# Patient Record
Sex: Female | Born: 1960 | Race: Black or African American | Hispanic: No | Marital: Married | Smoking: Never smoker
Health system: Southern US, Community
[De-identification: ages and names within clinical notes are randomized; demographics above are authoritative.]

## PROBLEM LIST (undated history)

## (undated) DIAGNOSIS — E669 Obesity, unspecified: Secondary | ICD-10-CM

## (undated) DIAGNOSIS — R7303 Prediabetes: Secondary | ICD-10-CM

## (undated) DIAGNOSIS — K589 Irritable bowel syndrome without diarrhea: Secondary | ICD-10-CM

## (undated) DIAGNOSIS — G4730 Sleep apnea, unspecified: Secondary | ICD-10-CM

## (undated) DIAGNOSIS — E559 Vitamin D deficiency, unspecified: Secondary | ICD-10-CM

## (undated) DIAGNOSIS — M549 Dorsalgia, unspecified: Secondary | ICD-10-CM

## (undated) DIAGNOSIS — M2550 Pain in unspecified joint: Secondary | ICD-10-CM

## (undated) DIAGNOSIS — N920 Excessive and frequent menstruation with regular cycle: Secondary | ICD-10-CM

## (undated) DIAGNOSIS — D649 Anemia, unspecified: Secondary | ICD-10-CM

## (undated) DIAGNOSIS — K829 Disease of gallbladder, unspecified: Secondary | ICD-10-CM

## (undated) DIAGNOSIS — R197 Diarrhea, unspecified: Secondary | ICD-10-CM

## (undated) DIAGNOSIS — E785 Hyperlipidemia, unspecified: Secondary | ICD-10-CM

## (undated) HISTORY — PX: LIPOSUCTION: SHX10

## (undated) HISTORY — DX: Hyperlipidemia, unspecified: E78.5

## (undated) HISTORY — DX: Prediabetes: R73.03

## (undated) HISTORY — PX: REDUCTION MAMMAPLASTY: SUR839

## (undated) HISTORY — DX: Dorsalgia, unspecified: M54.9

## (undated) HISTORY — DX: Pain in unspecified joint: M25.50

## (undated) HISTORY — DX: Vitamin D deficiency, unspecified: E55.9

## (undated) HISTORY — PX: OTHER SURGICAL HISTORY: SHX169

## (undated) HISTORY — PX: BREAST REDUCTION SURGERY: SHX8

## (undated) HISTORY — DX: Irritable bowel syndrome, unspecified: K58.9

## (undated) HISTORY — PX: BREAST BIOPSY: SHX20

## (undated) HISTORY — DX: Disease of gallbladder, unspecified: K82.9

## (undated) HISTORY — DX: Anemia, unspecified: D64.9

## (undated) HISTORY — DX: Excessive and frequent menstruation with regular cycle: N92.0

## (undated) HISTORY — DX: Obesity, unspecified: E66.9

## (undated) HISTORY — DX: Sleep apnea, unspecified: G47.30

## (undated) HISTORY — PX: CHOLECYSTECTOMY: SHX55

## (undated) HISTORY — DX: Diarrhea, unspecified: R19.7

---

## 2000-09-25 ENCOUNTER — Other Ambulatory Visit: Admission: RE | Admit: 2000-09-25 | Discharge: 2000-09-25 | Payer: Self-pay | Admitting: *Deleted

## 2002-07-22 ENCOUNTER — Other Ambulatory Visit: Admission: RE | Admit: 2002-07-22 | Discharge: 2002-07-22 | Payer: Self-pay | Admitting: Family Medicine

## 2007-02-09 ENCOUNTER — Other Ambulatory Visit: Admission: RE | Admit: 2007-02-09 | Discharge: 2007-02-09 | Payer: Self-pay | Admitting: Family Medicine

## 2010-07-23 ENCOUNTER — Ambulatory Visit: Payer: 59 | Attending: Family Medicine | Admitting: Physical Therapy

## 2010-07-23 DIAGNOSIS — M25619 Stiffness of unspecified shoulder, not elsewhere classified: Secondary | ICD-10-CM | POA: Insufficient documentation

## 2010-07-23 DIAGNOSIS — R293 Abnormal posture: Secondary | ICD-10-CM | POA: Insufficient documentation

## 2010-07-23 DIAGNOSIS — M542 Cervicalgia: Secondary | ICD-10-CM | POA: Insufficient documentation

## 2010-07-23 DIAGNOSIS — M25519 Pain in unspecified shoulder: Secondary | ICD-10-CM | POA: Insufficient documentation

## 2010-07-23 DIAGNOSIS — IMO0001 Reserved for inherently not codable concepts without codable children: Secondary | ICD-10-CM | POA: Insufficient documentation

## 2010-07-27 ENCOUNTER — Ambulatory Visit: Payer: 59 | Admitting: Physical Therapy

## 2010-07-30 ENCOUNTER — Ambulatory Visit: Payer: 59 | Admitting: Physical Therapy

## 2010-08-01 ENCOUNTER — Ambulatory Visit: Payer: 59 | Admitting: Physical Therapy

## 2010-08-03 ENCOUNTER — Ambulatory Visit: Payer: 59 | Admitting: Physical Therapy

## 2010-08-06 ENCOUNTER — Ambulatory Visit: Payer: 59 | Admitting: Physical Therapy

## 2010-08-08 ENCOUNTER — Ambulatory Visit: Payer: 59 | Admitting: Physical Therapy

## 2010-08-10 ENCOUNTER — Ambulatory Visit: Payer: 59 | Admitting: Physical Therapy

## 2010-08-13 ENCOUNTER — Ambulatory Visit: Payer: 59 | Attending: Family Medicine | Admitting: Physical Therapy

## 2010-08-13 DIAGNOSIS — IMO0001 Reserved for inherently not codable concepts without codable children: Secondary | ICD-10-CM | POA: Insufficient documentation

## 2010-08-13 DIAGNOSIS — M542 Cervicalgia: Secondary | ICD-10-CM | POA: Insufficient documentation

## 2010-08-13 DIAGNOSIS — R293 Abnormal posture: Secondary | ICD-10-CM | POA: Insufficient documentation

## 2010-08-13 DIAGNOSIS — M25619 Stiffness of unspecified shoulder, not elsewhere classified: Secondary | ICD-10-CM | POA: Insufficient documentation

## 2010-08-13 DIAGNOSIS — M25519 Pain in unspecified shoulder: Secondary | ICD-10-CM | POA: Insufficient documentation

## 2010-08-16 ENCOUNTER — Encounter: Payer: 59 | Admitting: Physical Therapy

## 2010-08-20 ENCOUNTER — Encounter: Payer: 59 | Admitting: Physical Therapy

## 2010-08-22 ENCOUNTER — Encounter: Payer: 59 | Admitting: Physical Therapy

## 2010-08-24 ENCOUNTER — Encounter: Payer: 59 | Admitting: Physical Therapy

## 2011-08-30 ENCOUNTER — Other Ambulatory Visit: Payer: Self-pay | Admitting: Family Medicine

## 2011-08-30 ENCOUNTER — Other Ambulatory Visit (HOSPITAL_COMMUNITY)
Admission: RE | Admit: 2011-08-30 | Discharge: 2011-08-30 | Disposition: A | Discharge status: Home / Self Care | Payer: 59 | Source: Ambulatory Visit | Attending: Family Medicine | Admitting: Family Medicine

## 2011-08-30 DIAGNOSIS — Z113 Encounter for screening for infections with a predominantly sexual mode of transmission: Secondary | ICD-10-CM | POA: Insufficient documentation

## 2011-08-30 DIAGNOSIS — Z1151 Encounter for screening for human papillomavirus (HPV): Secondary | ICD-10-CM | POA: Insufficient documentation

## 2011-08-30 DIAGNOSIS — Z124 Encounter for screening for malignant neoplasm of cervix: Secondary | ICD-10-CM | POA: Insufficient documentation

## 2012-09-03 ENCOUNTER — Encounter: Payer: Self-pay | Admitting: Internal Medicine

## 2012-09-11 HISTORY — PX: COLONOSCOPY: SHX174

## 2012-09-25 ENCOUNTER — Encounter: Payer: Self-pay | Admitting: Internal Medicine

## 2012-09-25 ENCOUNTER — Ambulatory Visit (INDEPENDENT_AMBULATORY_CARE_PROVIDER_SITE_OTHER): Payer: 59 | Admitting: Internal Medicine

## 2012-09-25 VITALS — BP 100/60 | HR 80 | Ht 61.0 in | Wt 168.2 lb

## 2012-09-25 DIAGNOSIS — Z1211 Encounter for screening for malignant neoplasm of colon: Secondary | ICD-10-CM

## 2012-09-25 DIAGNOSIS — K589 Irritable bowel syndrome without diarrhea: Secondary | ICD-10-CM

## 2012-09-25 MED ORDER — SOD PICOSULFATE-MAG OX-CIT ACD 10-3.5-12 MG-GM-GM PO PACK: 1.0000 | PACK | Freq: Once | ORAL | Status: DC

## 2012-09-25 NOTE — Patient Instructions (Addendum)
You have been scheduled for a colonoscopy with propofol. Please follow written instructions given to you at your visit today.  Please use the prep kit we have given you today, prepopik. If you use inhalers (even only as needed), please bring them with you on the day of your procedure. Your physician has requested that you go to www.startemmi.com and enter the access code given to you at your visit today. This web site gives a general overview about your procedure. However, you should still follow specific instructions given to you by our office regarding your preparation for the procedure.   I appreciate the opportunity to care for you.

## 2012-09-25 NOTE — Progress Notes (Signed)
Subjective:  Gretel Acre, MD   Patient ID: Guy Sandifer, female    DOB: [birth year], 52 y.o.   MRN: [id number]  HPI This very pleasant 52 yo woman originally from [place] is here because of chronic post-prandial loose and urgent defecation for 20+ years. She has tried loperamide, probiotics, dicyclomine and other ant-spasmodics. It began before she ever had a cholecystectomy.  She has never had a colonoscopy. No rectal bleeding.  She doeas have bloating and gas.  She denies nocturnal sxs and does not have this problem with urgent loose stools outside of eating or drinking. She has been gaining weight.  She has had a long-standing anemia - since childhood.  GI ROS o/w negative.  Allergies  Allergen Reactions  . Penicillins     Body stiffness   Outpatient Prescriptions Prior to Visit  Medication Sig Dispense Refill  . Iron-Vitamin C (VITRON-C PO) Take 1 capsule by mouth 2 (two) times daily. 125-200mg  tablets      . pravastatin (PRAVACHOL) 80 MG tablet Take 80 mg by mouth daily.      Marland Kitchen VITAMIN D, ERGOCALCIFEROL, PO Take 1 tablet by mouth once a week.      . pravastatin (PRAVACHOL) 40 MG tablet Take 40 mg by mouth daily.       No facility-administered medications prior to visit.   Past Medical History  Diagnosis Date  . Hyperlipidemia   . Anemia   . Menorrhagia   . Diarrhea    Past Surgical History  Procedure Laterality Date  . Breast reduction surgery    . Liposuction    . Cholecystectomy    . Uterine ablation      for menorrhagia   History   Social History  . Marital Status: Married    Spouse Name: N/A    Number of Children: 2  .     Occupational History  . Emergency planning/management officer at [employer]    Social History Main Topics  . Smoking status: Never Smoker   . Smokeless tobacco: Never Used  . Alcohol Use: No  . Drug Use: No    Family History  Problem Relation Age of Onset  . Glaucoma Father   . Hypertension Father   . Glaucoma Mother   . Hypertension Sister   .  Breast cancer Maternal Aunt     Review of Systems Menorrhagia and dysmenorrhea All other ROS negative except as in HPI    Objective:   Physical Exam General:  Well-developed, well-nourished and in no acute distress Eyes:  anicteric. Neck:   supple w/o thyromegaly or mass.  Lungs: Clear to auscultation bilaterally. Heart:  S1S2, no rubs, murmurs, gallops. Abdomen:  soft, non-tender, no hepatosplenomegaly, hernia, or mass and BS+.  Rectal: deferred Lymph:  no cervical or supraclavicular adenopathy. Extremities:   no edema Skin   no rash. Neuro:  A&O x 3.  Psych:  appropriate mood and  Affect.   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 She may be a candidate for Lotronex since she has failed numerous other agents in past. Some of the other medications she has tried have constipated her. Another option would be a bile salt binder like colestipol or cholestyramine. Since sxs predate cholecystectomy doubt bile salt-induced diarrhea but thes agents could possibly help.  Await results of screening colonoscopy. Keep celiac screening in mind though ethnicity atypical for that.  She does also have a chronic anemia, mildly microcytic, she says since childhood. Could be from menorrhagia but also could be a thalassemia. May need iron studies, etc and possibly Hgb electrophoresis if not ever done.  Special screening for malignant neoplasms, colon - Plan: schedule for screening colonoscopy The risks and benefits as well as alternatives of endoscopic procedure(s) have been discussed and reviewed. All questions answered. The patient agrees to proceed.  I appreciate the opportunity to care for this patient.  ZO:XWRUE, ADAKU, MD

## 2012-09-28 ENCOUNTER — Ambulatory Visit (AMBULATORY_SURGERY_CENTER): Payer: 59 | Admitting: Internal Medicine

## 2012-09-28 ENCOUNTER — Encounter: Payer: Self-pay | Admitting: Internal Medicine

## 2012-09-28 VITALS — BP 105/53 | HR 80 | Temp 98.7°F | Resp 16 | Ht 61.0 in | Wt 168.0 lb

## 2012-09-28 DIAGNOSIS — Z1211 Encounter for screening for malignant neoplasm of colon: Secondary | ICD-10-CM

## 2012-09-28 MED ORDER — SODIUM CHLORIDE 0.9 % IV SOLN: 500.0000 mL | INTRAVENOUS | Status: DC

## 2012-09-28 NOTE — Patient Instructions (Addendum)
The colonoscopy was normal except for rare diverticulosis. You had an excellent prep. I think the diarrhea problems are from Irritable Bowel Syndrome.  Please make an appointment to see me in the office and we can discuss further treatment of this problem.  You should repeat a colonoscopy in 2024 (10 years) for colon cancer screening and prevention.  I appreciate the opportunity to care for you. Iva Boop, MD, [facility]  Discharge instructions given with verbal understanding. Normal exam. Resume previous medications. YOU HAD AN ENDOSCOPIC PROCEDURE TODAY AT THE [facility]: Refer to the procedure report that was given to you for any specific questions about what was found during the examination.  If the procedure report does not answer your questions, please call your gastroenterologist to clarify.  If you requested that your care partner not be given the details of your procedure findings, then the procedure report has been included in a sealed envelope for you to review at your convenience later.  YOU SHOULD EXPECT: Some feelings of bloating in the abdomen. Passage of more gas than usual.  Walking can help get rid of the air that was put into your GI tract during the procedure and reduce the bloating. If you had a lower endoscopy (such as a colonoscopy or flexible sigmoidoscopy) you may notice spotting of blood in your stool or on the toilet paper. If you underwent a bowel prep for your procedure, then you may not have a normal bowel movement for a few days.  DIET: Your first meal following the procedure should be a light meal and then it is ok to progress to your normal diet.  A half-sandwich or bowl of soup is an example of a good first meal.  Heavy or fried foods are harder to digest and may make you feel nauseous or bloated.  Likewise meals heavy in dairy and vegetables can cause extra gas to form and this can also increase the bloating.  Drink plenty of fluids but you should  avoid alcoholic beverages for 24 hours.  ACTIVITY: Your care partner should take you home directly after the procedure.  You should plan to take it easy, moving slowly for the rest of the day.  You can resume normal activity the day after the procedure however you should NOT DRIVE or use heavy machinery for 24 hours (because of the sedation medicines used during the test).    SYMPTOMS TO REPORT IMMEDIATELY: A gastroenterologist can be reached at any hour.  During normal business hours, 8:30 AM to 5:00 PM Monday through Friday, call [phone].  After hours and on weekends, please call the GI answering service at [phone] who will take a message and have the physician on call contact you.   Following lower endoscopy (colonoscopy or flexible sigmoidoscopy):  Excessive amounts of blood in the stool  Significant tenderness or worsening of abdominal pains  Swelling of the abdomen that is new, acute  Fever of 100F or higher  FOLLOW UP: If any biopsies were taken you will be contacted by phone or by letter within the next 1-3 weeks.  Call your gastroenterologist if you have not heard about the biopsies in 3 weeks.  Our staff will call the home number listed on your records the next business day following your procedure to check on you and address any questions or concerns that you may have at that time regarding the information given to you following your procedure. This is a courtesy call and so if  there is no answer at the home number and we have not heard from you through the emergency physician on call, we will assume that you have returned to your regular daily activities without incident.  SIGNATURES/CONFIDENTIALITY: You and/or your care partner have signed paperwork which will be entered into your electronic medical record.  These signatures attest to the fact that that the information above on your After Visit Summary has been reviewed and is understood.  Full responsibility of the  confidentiality of this discharge information lies with you and/or your care-partner.

## 2012-09-28 NOTE — Op Note (Signed)
[facility] [address] [place], [address]   COLONOSCOPY PROCEDURE REPORT  PATIENT: [name], Rachael  MR#: [id number] BIRTHDATE: Oct 15, 1960 , 51  yrs. old GENDER: Female ENDOSCOPIST: Iva Boop, MD, [facility] REFERRED BY:   Dr. Fredirick Maudlin PROCEDURE DATE:  09/28/2012 PROCEDURE:   Colonoscopy, screening First Screening Colonoscopy - Avg.  risk and is 50 yrs.  old or older Yes.  Prior Negative Screening - Now for repeat screening. N/A  History of Adenoma - Now for follow-up colonoscopy & has been > or = to 3 yrs.  N/A  Polyps Removed Today? No.  Recommend repeat exam, <10 yrs? No. ASA CLASS:   Class II INDICATIONS:average risk screening and first colonoscopy. MEDICATIONS: Propofol (Diprivan) 340 mg IV, MAC sedation, administered by CRNA, and These medications were titrated to patient response per physician's verbal order  DESCRIPTION OF PROCEDURE:   After the risks benefits and alternatives of the procedure were thoroughly explained, informed consent was obtained.  A digital rectal exam revealed no abnormalities of the rectum.   The LB [id number]  endoscope was introduced through the anus and advanced to the terminal ileum which was intubated for a short distance. No adverse events experienced.   The quality of the prep was Prepopik excellent  The instrument was then slowly withdrawn as the colon was fully examined.   COLON FINDINGS: A normal appearing cecum, ileocecal valve, and appendiceal orifice were identified.  The ascending, hepatic flexure, transverse, splenic flexure, descending, sigmoid colon and rectum appeared unremarkable.  No polyps or cancers were seen.   A right colon retroflexion was performed.   The mucosa appeared normal in the terminal ileum.  Retroflexed views revealed no abnormalities. The time to cecum=1 minutes 38 seconds.  Withdrawal time=9 minutes 40 seconds.  The scope was withdrawn and the procedure completed. COMPLICATIONS:  There were no complications.  ENDOSCOPIC IMPRESSION: 1.   Normal colon - excellent prep - first colonoscopy 2.   Normal mucosa in the terminal ileum  RECOMMENDATIONS: 1.  Repeat colonoscopy 10 years. 2.   Call soon and make an appointment to see Dr.  Leone Payor in September or October to discuss treatment of IBS   eSigned:  Iva Boop, MD, [facility] 09/28/2012 4:46 PM  cc: The Patient   and Dr. Mervyn Skeeters Nnodi

## 2012-09-28 NOTE — Progress Notes (Signed)
Patient did not experience any of the following events: a burn prior to discharge; a fall within the facility; wrong site/side/patient/procedure/implant event; or a hospital transfer or hospital admission upon discharge from the facility. (G8907) Patient did not have preoperative order for IV antibiotic SSI prophylaxis. (G8918)  

## 2012-09-29 ENCOUNTER — Telehealth: Payer: Self-pay | Admitting: *Deleted

## 2012-09-29 NOTE — Telephone Encounter (Signed)
  Follow up Call-  Call back number 09/28/2012  Post procedure Call Back phone  # [phone]  Permission to leave phone message Yes     Patient questions:  Do you have a fever, pain , or abdominal swelling? no Pain Score  0 *  Have you tolerated food without any problems? yes  Have you been able to return to your normal activities? yes  Do you have any questions about your discharge instructions: Diet   no Medications  no Follow up visit  no  Do you have questions or concerns about your Care? no  Actions: * If pain score is 4 or above: No action needed, pain <4.

## 2013-09-20 ENCOUNTER — Other Ambulatory Visit: Payer: Self-pay

## 2014-09-06 DIAGNOSIS — E785 Hyperlipidemia, unspecified: Secondary | ICD-10-CM | POA: Insufficient documentation

## 2014-09-06 DIAGNOSIS — D649 Anemia, unspecified: Secondary | ICD-10-CM | POA: Insufficient documentation

## 2016-11-18 ENCOUNTER — Encounter: Payer: Self-pay | Admitting: Dietician

## 2016-11-18 ENCOUNTER — Encounter: Payer: 59 | Attending: Obstetrics and Gynecology | Admitting: Dietician

## 2016-11-18 DIAGNOSIS — Z6833 Body mass index (BMI) 33.0-33.9, adult: Secondary | ICD-10-CM | POA: Diagnosis not present

## 2016-11-18 DIAGNOSIS — K580 Irritable bowel syndrome with diarrhea: Secondary | ICD-10-CM | POA: Insufficient documentation

## 2016-11-18 DIAGNOSIS — Z713 Dietary counseling and surveillance: Secondary | ICD-10-CM | POA: Diagnosis not present

## 2016-11-18 NOTE — Progress Notes (Signed)
Medical Nutrition Therapy:  Appt start time: 1445 end time:  1445.   Assessment:  Primary concerns today: Patient is here today alone.  She has been referred for IBS with diarrhea.  She wants to know how to control this more.  Other history includes anemia, hyperlipidemia, and vitamin D deficiency.    Patient lives alone.  She works for [employer].  She is from [place], [place].  Her IBS symptoms are much better in [place] even when eating street food.  She brought several cookbooks with foods from her country that she enjoys.  She states that she tolerates all of them.  She has been in the [place] since 1991 and developed IBS symptoms in 1993/94.  Weight today is 177 lbs.  She reports gaining 30 lbs in the past 2 years despite not eating much and diarrhea often when she does eat.  Many foods also make her itch when she eats them as well as diarrhea. (tiger nuts, tree nuts, fruit).  She has tried a probiotic in the past but did not see a difference in her symptoms.  She will not eat and skip meals at times to avoid symptoms.  Preferred Learning Style:   No preference indicated   Learning Readiness:   Ready  MEDICATIONS: MVI   DIETARY INTAKE: Boring eating, don't eat sometimes because of her IBS.  Most food causes IBS.  She states that her symptoms are better when in [place]. Usual eating pattern includes 1-2 meals and 0 snacks per day.  24-hr recall:  B ( AM): water with vinegar (90% of the time)  Snk ( AM): none  L ( PM): sandwich from cafeteria at work ([place] with cheese) Snk ( PM): none D ( PM): skips most often ("drink water and go to sleep") or chicken, rice, salsa (tomatoes, peppers, onions) Snk ( PM): none Beverages: water, water with vinegar, rare regular soda, hot tea with sugar sometimes with small amount of canned milk, kambucha  Usual physical activity: week ends treadmill or walk in the park or gym or treadmill on weekdays when she is able to get out of work on  time.   Progress Towards Goal(s):  In progress.   Nutritional Diagnosis:  NB-1.2 Harmful beliefs/attitudes about food or nutrition-related topics (use with caution) As related to foods to help with IBS.  As evidenced by patient report and diet hx.    Intervention:  Nutrition counseling/education related to food tolerance, healthy nutrition, and IBS symptoms.  Discussed the brain/body connection as well.  Foods tolerated  Goat, lamb, poultry, fish  Rice  Milliet  Corn (pourage)  Greens   Peanuts  Sweet potatoes  Yucca  Boiled pineapple drink with Hibiscus and ginger  Fermented foods  Canned milk  Smoothie (spinach, kale, chia seeds, apple juice, ginger)  Tea  Plantains  Onions, cooked tomatoes  Eggs   Black eyed peas  Foods not tolerated  Tree nuts  Fruit (including bananas)  Milk (fresh)  Greasy foods  Mayo  Salad dressing  Carrots  Raw vegetables (except blended in smoothie)  Aim to avoid skipping meals as much as possible.  Breakfast (pourage, eggs OR smoothie)  Consider trying Pronourish supplement. Small meals Low fat Caffeine can cause problems with IBS.  Consider decaf tea. Avoid high fructose corn syrup  Consider getting checked for celiac disease (unless you have already) Resume taking your Vitamin D  Consider allergy testing  MRT test at [facility] (as an option) [phone]  Teaching Method Utilized:  Visual Auditory  Handouts given during visit include:  IBS nutrition therapy from [facility]  Barriers to learning/adherence to lifestyle change: stress  Demonstrated degree of understanding via:  Teach Back   Monitoring/Evaluation:  Dietary intake, exercise, and body weight prn.

## 2016-11-18 NOTE — Patient Instructions (Addendum)
Foods tolerated  Goat, lamb, poultry, fish  Rice  Milliet  Corn (pourage)  Greens   Peanuts  Sweet potatoes  Yucca  Boiled pineapple drink with Hibiscus and ginger  Fermented foods  Canned milk  Smoothie (spinach, kale, chia seeds, apple juice, ginger)  Tea  Plantains  Onions, cooked tomatoes  Eggs   Black eyed peas  Foods not tolerated  Tree nuts  Fruit (including bananas)  Milk (fresh)  Greasy foods  Mayo  Salad dressing  Carrots  Raw vegetables (except blended in smoothie)  Aim to avoid skipping meals as much as possible.  Breakfast (pourage, eggs OR smoothie)  Consider trying Pronourish supplement. Small meals Low fat Caffeine can cause problems with IBS.  Consider decaf tea. Avoid high fructose corn syrup  Consider getting checked for celiac disease (unless you have already) Resume taking your Vitamin D  Consider allergy testing  MRT test at [facility] (as an option) [phone]

## 2017-06-18 DIAGNOSIS — J069 Acute upper respiratory infection, unspecified: Secondary | ICD-10-CM | POA: Diagnosis not present

## 2017-08-18 DIAGNOSIS — Z0000 Encounter for general adult medical examination without abnormal findings: Secondary | ICD-10-CM | POA: Diagnosis not present

## 2017-08-18 DIAGNOSIS — Z1211 Encounter for screening for malignant neoplasm of colon: Secondary | ICD-10-CM | POA: Diagnosis not present

## 2017-08-18 DIAGNOSIS — E7800 Pure hypercholesterolemia, unspecified: Secondary | ICD-10-CM | POA: Diagnosis not present

## 2017-10-03 DIAGNOSIS — Z1231 Encounter for screening mammogram for malignant neoplasm of breast: Secondary | ICD-10-CM | POA: Diagnosis not present

## 2017-10-08 DIAGNOSIS — Z6834 Body mass index (BMI) 34.0-34.9, adult: Secondary | ICD-10-CM | POA: Diagnosis not present

## 2017-10-08 DIAGNOSIS — R87610 Atypical squamous cells of undetermined significance on cytologic smear of cervix (ASC-US): Secondary | ICD-10-CM | POA: Diagnosis not present

## 2017-10-08 DIAGNOSIS — Z01411 Encounter for gynecological examination (general) (routine) with abnormal findings: Secondary | ICD-10-CM | POA: Diagnosis not present

## 2017-10-08 DIAGNOSIS — Z124 Encounter for screening for malignant neoplasm of cervix: Secondary | ICD-10-CM | POA: Diagnosis not present

## 2017-11-28 DIAGNOSIS — R52 Pain, unspecified: Secondary | ICD-10-CM | POA: Diagnosis not present

## 2017-12-04 ENCOUNTER — Other Ambulatory Visit: Payer: Self-pay | Admitting: Obstetrics and Gynecology

## 2017-12-04 DIAGNOSIS — N72 Inflammatory disease of cervix uteri: Secondary | ICD-10-CM | POA: Diagnosis not present

## 2017-12-04 DIAGNOSIS — R87610 Atypical squamous cells of undetermined significance on cytologic smear of cervix (ASC-US): Secondary | ICD-10-CM | POA: Diagnosis not present

## 2019-12-01 ENCOUNTER — Other Ambulatory Visit: Payer: Self-pay | Admitting: Obstetrics and Gynecology

## 2021-04-19 ENCOUNTER — Ambulatory Visit
Admission: RE | Admit: 2021-04-19 | Discharge: 2021-04-19 | Disposition: A | Discharge status: Home / Self Care | Payer: 59 | Source: Ambulatory Visit | Attending: Family Medicine | Admitting: Family Medicine

## 2021-04-19 ENCOUNTER — Other Ambulatory Visit: Payer: Self-pay | Admitting: Family Medicine

## 2021-04-19 DIAGNOSIS — R102 Pelvic and perineal pain: Secondary | ICD-10-CM

## 2021-12-12 ENCOUNTER — Encounter (INDEPENDENT_AMBULATORY_CARE_PROVIDER_SITE_OTHER): Payer: 59 | Admitting: Family Medicine

## 2021-12-12 DIAGNOSIS — Z0289 Encounter for other administrative examinations: Secondary | ICD-10-CM

## 2021-12-27 ENCOUNTER — Ambulatory Visit (INDEPENDENT_AMBULATORY_CARE_PROVIDER_SITE_OTHER): Payer: 59 | Admitting: Family Medicine

## 2021-12-27 ENCOUNTER — Encounter (INDEPENDENT_AMBULATORY_CARE_PROVIDER_SITE_OTHER): Payer: Self-pay | Admitting: Family Medicine

## 2021-12-27 VITALS — BP 126/78 | HR 100 | Temp 98.4°F | Ht 62.0 in | Wt 181.0 lb

## 2021-12-27 DIAGNOSIS — R7303 Prediabetes: Secondary | ICD-10-CM

## 2021-12-27 DIAGNOSIS — Z6834 Body mass index (BMI) 34.0-34.9, adult: Secondary | ICD-10-CM

## 2021-12-27 DIAGNOSIS — E782 Mixed hyperlipidemia: Secondary | ICD-10-CM

## 2021-12-27 DIAGNOSIS — E559 Vitamin D deficiency, unspecified: Secondary | ICD-10-CM | POA: Diagnosis not present

## 2021-12-27 DIAGNOSIS — Z1331 Encounter for screening for depression: Secondary | ICD-10-CM | POA: Diagnosis not present

## 2021-12-27 DIAGNOSIS — E669 Obesity, unspecified: Secondary | ICD-10-CM

## 2021-12-27 DIAGNOSIS — R5383 Other fatigue: Secondary | ICD-10-CM | POA: Diagnosis not present

## 2021-12-27 DIAGNOSIS — R0602 Shortness of breath: Secondary | ICD-10-CM | POA: Diagnosis not present

## 2021-12-27 DIAGNOSIS — G4733 Obstructive sleep apnea (adult) (pediatric): Secondary | ICD-10-CM

## 2021-12-28 LAB — LIPID PANEL WITH LDL/HDL RATIO
Cholesterol, Total: 143 mg/dL (ref 100–199)
HDL: 50 mg/dL (ref >39)
LDL Chol Calc (NIH): 78 mg/dL (ref 0–99)
LDL/HDL Ratio: 1.6 ratio (ref 0.0–3.2)
Triglycerides: 79 mg/dL (ref 0–149)
VLDL Cholesterol Cal: 15 mg/dL (ref 5–40)

## 2021-12-28 LAB — FOLATE: Folate: >20 ng/mL (ref >3.0)

## 2021-12-28 LAB — COMPREHENSIVE METABOLIC PANEL
ALT: 22 IU/L (ref 0–32)
AST: 21 IU/L (ref 0–40)
Albumin/Globulin Ratio: 2 (ref 1.2–2.2)
Albumin: 4.9 g/dL (ref 3.8–4.9)
Alkaline Phosphatase: 79 IU/L (ref 44–121)
BUN/Creatinine Ratio: 11 — ABNORMAL LOW (ref 12–28)
BUN: 6 mg/dL — ABNORMAL LOW (ref 8–27)
Bilirubin Total: 0.3 mg/dL (ref 0.0–1.2)
CO2: 24 mmol/L (ref 20–29)
Calcium: 9.5 mg/dL (ref 8.7–10.3)
Chloride: 103 mmol/L (ref 96–106)
Creatinine, Ser: 0.56 mg/dL — ABNORMAL LOW (ref 0.57–1.00)
Globulin, Total: 2.4 g/dL (ref 1.5–4.5)
Glucose: 94 mg/dL (ref 70–99)
Potassium: 3.7 mmol/L (ref 3.5–5.2)
Sodium: 142 mmol/L (ref 134–144)
Total Protein: 7.3 g/dL (ref 6.0–8.5)
eGFR: 104 mL/min/{1.73_m2} (ref >59)

## 2021-12-28 LAB — CBC WITH DIFFERENTIAL/PLATELET
Basophils Absolute: 0 10*3/uL (ref 0.0–0.2)
Basos: 1 %
EOS (ABSOLUTE): 0.1 10*3/uL (ref 0.0–0.4)
Eos: 1 %
Hematocrit: 39 % (ref 34.0–46.6)
Hemoglobin: 12 g/dL (ref 11.1–15.9)
Immature Grans (Abs): 0 10*3/uL (ref 0.0–0.1)
Immature Granulocytes: 0 %
Lymphocytes Absolute: 1.7 10*3/uL (ref 0.7–3.1)
Lymphs: 38 %
MCH: 26.7 pg (ref 26.6–33.0)
MCHC: 30.8 g/dL — ABNORMAL LOW (ref 31.5–35.7)
MCV: 87 fL (ref 79–97)
Monocytes Absolute: 0.2 10*3/uL (ref 0.1–0.9)
Monocytes: 5 %
Neutrophils Absolute: 2.5 10*3/uL (ref 1.4–7.0)
Neutrophils: 55 %
Platelets: 213 10*3/uL (ref 150–450)
RBC: 4.49 x10E6/uL (ref 3.77–5.28)
RDW: 14 % (ref 11.7–15.4)
WBC: 4.5 10*3/uL (ref 3.4–10.8)

## 2021-12-28 LAB — VITAMIN B12: Vitamin B-12: 900 pg/mL (ref 232–1245)

## 2021-12-28 LAB — TSH: TSH: 1.45 u[IU]/mL (ref 0.450–4.500)

## 2021-12-28 LAB — T3: T3, Total: 137 ng/dL (ref 71–180)

## 2021-12-28 LAB — HEMOGLOBIN A1C
Est. average glucose Bld gHb Est-mCnc: 128 mg/dL
Hgb A1c MFr Bld: 6.1 % — ABNORMAL HIGH (ref 4.8–5.6)

## 2021-12-28 LAB — T4, FREE: Free T4: 1.44 ng/dL (ref 0.82–1.77)

## 2021-12-28 LAB — INSULIN, RANDOM: INSULIN: 12.1 u[IU]/mL (ref 2.6–24.9)

## 2021-12-28 LAB — VITAMIN D 25 HYDROXY (VIT D DEFICIENCY, FRACTURES): Vit D, 25-Hydroxy: 29.8 ng/mL — ABNORMAL LOW (ref 30.0–100.0)

## 2022-01-09 ENCOUNTER — Ambulatory Visit (INDEPENDENT_AMBULATORY_CARE_PROVIDER_SITE_OTHER): Payer: 59 | Admitting: Primary Care

## 2022-01-09 VITALS — BP 122/86 | HR 116

## 2022-01-09 DIAGNOSIS — R5383 Other fatigue: Secondary | ICD-10-CM | POA: Diagnosis not present

## 2022-01-09 DIAGNOSIS — E559 Vitamin D deficiency, unspecified: Secondary | ICD-10-CM | POA: Insufficient documentation

## 2022-01-09 DIAGNOSIS — R0683 Snoring: Secondary | ICD-10-CM | POA: Diagnosis not present

## 2022-01-09 DIAGNOSIS — G4709 Other insomnia: Secondary | ICD-10-CM | POA: Diagnosis not present

## 2022-01-09 DIAGNOSIS — G4700 Insomnia, unspecified: Secondary | ICD-10-CM | POA: Insufficient documentation

## 2022-01-09 DIAGNOSIS — N926 Irregular menstruation, unspecified: Secondary | ICD-10-CM | POA: Insufficient documentation

## 2022-01-09 MED ORDER — DIPHENHYDRAMINE HCL 50 MG PO TABS: 50.0000 mg | ORAL_TABLET | Freq: Every evening | ORAL | 0 refills | Status: DC | PRN

## 2022-01-09 MED ORDER — DIPHENHYDRAMINE HCL 50 MG PO TABS: 50.0000 mg | ORAL_TABLET | Freq: Every evening | ORAL | 0 refills | Status: AC | PRN

## 2022-01-09 NOTE — Progress Notes (Signed)
@Patient  ID: , female    DOB: 06-09-1960, 61 y.o.   MRN: [id number]  Chief Complaint  Patient presents with   Consult    Referring provider: [id number], MD  HPI: 61 year old female, ever smoked.  Past medical history significant for IBS, anemia, hyperlipidemia, vitamin D deficiency, fatigue.   01/09/2022 Patient presents today for sleep consult. She has symptoms for chronic fatigue and insomnia. She has issues sleeping at night. She reports loud snoring.  Typical bedtime is between 9 and 10 PM.  It can take her up to 2 to 3 hours to fall asleep. She will take NyQuil after a night or two of not sleeping well which will help her fall asleep. She wakes up on average once a night.  She starts her day at 5 AM.  Weight is up 25 pounds.  No previous sleep study.  She works from home. Denies symptoms of narcolepsy, cataplexy or sleepwalking.  Sleep questionnaire Symptoms- sleeping less than 5 hours a night, struggle to sleep    Prior sleep study- none  Bedtime- 9-10pm  Time to fall asleep- 2-3 hours Nocturnal awakenings- once Out of bed/start of day- 5am Weight changes- up 25 lbs Do you operate heavy machinery- no Do you currently wear CPAP- no Do you current wear oxygen- no Epworth- 5  Allergies  Allergen Reactions   Penicillins     Body stiffness    Immunization History  Administered Date(s) Administered   PFIZER Comirnaty(Gray Top)Covid-19 Tri-Sucrose Vaccine 10/02/2020   Tdap 08/30/2011    Past Medical History:  Diagnosis Date   Anemia    Back pain    Diarrhea    Gallbladder problem    Hyperlipidemia    IBS (irritable bowel syndrome)    Joint pain    Menorrhagia    Obesity    Prediabetes    Sleep apnea    Vitamin D deficiency     Tobacco History: Social History   Tobacco Use  Smoking Status Never  Smokeless Tobacco Never   Counseling given: Not Answered   Outpatient Medications Prior to Visit  Medication Sig Dispense Refill    folic acid (FOLVITE) 1 MG tablet Take 1 mg by mouth daily.     Probiotic Product (PROBIOTIC DAILY PO) Take by mouth.     Rhubarb (ESTROVERA) 4 MG TABS Take 4 mg by mouth.     rosuvastatin (CRESTOR) 10 MG tablet Take 10 mg by mouth daily.     No facility-administered medications prior to visit.    Review of Systems  Review of Systems  Constitutional: Negative.   HENT: Negative.    Respiratory: Negative.    Cardiovascular: Negative.   Psychiatric/Behavioral:  Positive for sleep disturbance.     Physical Exam  BP 122/86 (BP Location: Left Arm, Cuff Size: Normal)   Pulse (!) 116   LMP 09/22/2012   SpO2 99%  Physical Exam Constitutional:      General: She is not in acute distress.    Appearance: Normal appearance. She is not ill-appearing.  HENT:     Head: Normocephalic and atraumatic.     Mouth/Throat:     Mouth: Mucous membranes are moist.     Pharynx: Oropharynx is clear.  Cardiovascular:     Rate and Rhythm: Regular rhythm. Tachycardia present.     Comments: Regular rhythm; HR between 100-115 Pulmonary:     Effort: Pulmonary effort is normal.     Breath sounds: Normal breath sounds. No wheezing, rhonchi  or rales.  Skin:    General: Skin is warm and dry.  Neurological:     General: No focal deficit present.     Mental Status: She is alert and oriented to person, place, and time. Mental status is at baseline.  Psychiatric:        Mood and Affect: Mood normal.        Behavior: Behavior normal.        Thought Content: Thought content normal.        Judgment: Judgment normal.      Lab Results:  CBC    Component Value Date/Time   WBC 4.5 12/27/2021 0920   RBC 4.49 12/27/2021 0920   HGB 12.0 12/27/2021 0920   HCT 39.0 12/27/2021 0920   PLT 213 12/27/2021 0920   MCV 87 12/27/2021 0920   MCH 26.7 12/27/2021 0920   MCHC 30.8 (L) 12/27/2021 0920   RDW 14.0 12/27/2021 0920   LYMPHSABS 1.7 12/27/2021 0920   EOSABS 0.1 12/27/2021 0920   BASOSABS 0.0 12/27/2021  0920    BMET    Component Value Date/Time   NA 142 12/27/2021 0920   K 3.7 12/27/2021 0920   CL 103 12/27/2021 0920   CO2 24 12/27/2021 0920   GLUCOSE 94 12/27/2021 0920   BUN 6 (L) 12/27/2021 0920   CREATININE 0.56 (L) 12/27/2021 0920   CALCIUM 9.5 12/27/2021 0920    BNP No results found for: "BNP"  ProBNP No results found for: "PROBNP"  Imaging: No results found.   Assessment & Plan:   Snoring - Patient has symptoms of loud snoring, insomnia and fatigue. Epworth score 5. Concern patient could have underlying sleep apnea. Needs home sleep study to evaluate.  Discussed risks of untreated sleep apnea including cardiac arrhythmias, PE, pulmonary hypertension and diabetes.  We also reviewed treatment options including weight loss, oral appliance, CPAP or referral to ENT for possible surgical options.  Encourage weight loss efforts and side sleeping position.  Advised against driving if experiencing excessive daytime sleepiness fatigue.  Follow-up in 1 to 2 weeks after sleep study review results and treatment options  Fatigue - Labs with PCP were essentially normal  - Needs HST to rule out sleep apnea as cause of fatigue  Insomnia - Encourage patient maintain routine sleep schedule and limit amount of time she spends in bed not sleeping - Rx diphenhydramine 50mg  at bedtime for insomnia    , NP 01/09/2022

## 2022-01-09 NOTE — Assessment & Plan Note (Signed)
-   Encourage patient maintain routine sleep schedule and limit amount of time she spends in bed not sleeping - Rx diphenhydramine 50mg  at bedtime for insomnia

## 2022-01-09 NOTE — Assessment & Plan Note (Signed)
-   Patient has symptoms of loud snoring, insomnia and fatigue. Epworth score 5. Concern patient could have underlying sleep apnea. Needs home sleep study to evaluate.  Discussed risks of untreated sleep apnea including cardiac arrhythmias, PE, pulmonary hypertension and diabetes.  We also reviewed treatment options including weight loss, oral appliance, CPAP or referral to ENT for possible surgical options.  Encourage weight loss efforts and side sleeping position.  Advised against driving if experiencing excessive daytime sleepiness fatigue.  Follow-up in 1 to 2 weeks after sleep study review results and treatment options

## 2022-01-09 NOTE — Assessment & Plan Note (Addendum)
-   Labs with PCP were essentially normal  - Needs HST to rule out sleep apnea as cause of fatigue

## 2022-01-09 NOTE — Patient Instructions (Signed)
  Sleep apnea is defined as period of 10 seconds or longer when you stop breathing at night. This can happen multiple times a night. Dx sleep apnea is when this occurs more than 5 times an hour.    Mild OSA 5-15 apneic events an hour Moderate OSA 15-30 apneic events an hour Severe OSA > 30 apneic events an hour   Untreated sleep apnea puts you at higher risk for cardiac arrhythmias, pulmonary HTN, stroke and diabetes  Treatment options include weight loss, side sleeping position, oral appliance, CPAP therapy or referral to ENT for possible surgical options    Recommendations: - Focus on side sleeping position or elevate head with wedge pillow 30 degrees - Work on weight loss efforts if able  - Do not drive if experiencing excessive daytime sleepiness of fatigue  - Take diphenhydramine 50mg  at bedtime for insomnia    Orders: Home sleep study re: loud snoring    Follow-up: Please call to schedule follow-up 1-2 weeks after completing home sleep study to review results and treatment if needed (can be virtual)

## 2022-01-10 ENCOUNTER — Ambulatory Visit (INDEPENDENT_AMBULATORY_CARE_PROVIDER_SITE_OTHER): Payer: 59 | Admitting: Family Medicine

## 2022-01-10 VITALS — BP 122/80 | HR 78 | Temp 98.5°F | Ht 62.0 in | Wt 180.0 lb

## 2022-01-10 DIAGNOSIS — E7849 Other hyperlipidemia: Secondary | ICD-10-CM | POA: Diagnosis not present

## 2022-01-10 DIAGNOSIS — R5383 Other fatigue: Secondary | ICD-10-CM

## 2022-01-10 DIAGNOSIS — E559 Vitamin D deficiency, unspecified: Secondary | ICD-10-CM

## 2022-01-10 DIAGNOSIS — Z6833 Body mass index (BMI) 33.0-33.9, adult: Secondary | ICD-10-CM

## 2022-01-10 DIAGNOSIS — R7303 Prediabetes: Secondary | ICD-10-CM | POA: Diagnosis not present

## 2022-01-10 DIAGNOSIS — E669 Obesity, unspecified: Secondary | ICD-10-CM

## 2022-01-10 MED ORDER — VITAMIN D (ERGOCALCIFEROL) 1.25 MG (50000 UNIT) PO CAPS: 50000.0000 [IU] | ORAL_CAPSULE | ORAL | 0 refills | Status: DC

## 2022-01-10 NOTE — Progress Notes (Signed)
Reviewed and agree with assessment/plan.   Coralyn Helling, MD [facility] Pulmonary/Critical Care 01/10/2022, 8:15 AM Pager:  [phone]

## 2022-01-14 NOTE — Progress Notes (Signed)
Chief Complaint:   OBESITY Rachael [name] (MR# [id number]) is a 61 y.o. female who presents for evaluation and treatment of obesity and related comorbidities. Current BMI is Body mass index is 33.11 kg/m. Rachael [name] has been struggling with her weight for many years and has been unsuccessful in either losing weight, maintaining weight loss, or reaching her healthy weight goal.  Rachael [name] was referred by Dr. Estanislado Pandy. Lactose sensitive. 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 Lives alone. She skips diner daily. Cup of tea in am with honey. Lunch is salad +rice/cous cous (1 cup). Very occasional eats meat--mostly eggs and fish.  Rachael [name] is currently in the action stage of change and ready to dedicate time achieving and maintaining a healthier weight. Rachael [name] is interested in becoming our patient and working on intensive lifestyle modifications including (but not limited to) diet and exercise for weight loss.  Rachael [name]'s habits were reviewed today and are as follows: her desired weight loss is 40 ponds, she has been heavy most of her life, she started gaining weight in 2022, her heaviest weight ever was 180 pounds, she skips meals frequently, she frequently makes poor food choices, and she struggles with emotional eating.  Depression Screen Rachael [name]'s Food and Mood (modified PHQ-9) score was 16.     11/18/2016    2:48 PM  Depression screen PHQ 2/9  Decreased Interest 0  Down, Depressed, Hopeless 0  PHQ - 2 Score 0   Subjective:   1. Other fatigue EKG--NSR abnormality of T wave. Rachael [name] admits to daytime somnolence and reports waking up still tired. Patient has a history of symptoms of daytime fatigue and morning fatigue. Rachael [name] generally gets 4 or 5 hours of sleep per night, and states that she has nightime awakenings. Snoring is present. Apneic episodes are present. Epworth Sleepiness Score is 8.    2. SOBOE (shortness of breath on exertion) Rachael [name] notes increasing shortness of  breath with exercising and seems to be worsening over time with weight gain. She notes getting out of breath sooner with activity than she used to. This has not gotten worse recently. Rachael [name] denies shortness of breath at rest or orthopnea.   3. Prediabetes Rachael [name]'s last A1c was 6.0 per patient. She is not on medications.  4. Vitamin D deficiency Rachael [name]'s last Vit D level at [facility] of 30s. Denies any nausea, vomiting or muscle weakness. She notes fatigue.  5. Mixed hyperlipidemia Rachael [name]'s Cholesterol has been elevated most of her adult life. She is taking Rosuvastatin 10 mg.  6. OSA (obstructive sleep apnea) Rachael [name] was never formally diagnosed but told she stops breathing.  Assessment/Plan:   1. Other fatigue We will obtain labs today. Rachael [name] does feel that her weight is causing her energy to be lower than it should be. Fatigue may be related to obesity, depression or many other causes. Labs will be ordered, and in the meanwhile, Rachael [name] will focus on self care including making healthy food choices, increasing physical activity and focusing on stress reduction.   - EKG 12-Lead - Vitamin B12 - Folate - TSH - T4, free - T3  2. SOBOE (shortness of breath on exertion) We will obtain labs today. Rachael [name] does feel that she gets out of breath more easily that she used to when she exercises. Rachael [name]'s shortness of breath appears to be obesity related and exercise induced. She has agreed to work on weight loss and gradually increase exercise to treat her exercise induced shortness of  breath. Will continue to monitor closely.   - CBC with Differential/Platelet  3. Prediabetes We will obtain labs today.  - Comprehensive metabolic panel - Hemoglobin A1c - Insulin, random  4. Vitamin D deficiency We will obtain labs today.  - VITAMIN D 25 Hydroxy (Vit-D Deficiency, Fractures)  5. Mixed hyperlipidemia We will obtain labs today.  - Lipid Panel With LDL/HDL Ratio  6. OSA (obstructive sleep  apnea) We will refer to [facility] for Sleep study.  - Ambulatory referral to Pulmonology  7. Screening for depression Rachael [name] had a positive depression screening. Depression is commonly associated with obesity and often results in emotional eating behaviors. We will monitor this closely and work on CBT to help improve the non-hunger eating patterns. Referral to Psychology may be required if no improvement is seen as she continues in our clinic.   8. Class 1 obesity with serious comorbidity and body mass index (BMI) of 34.0 to 34.9 in adult, unspecified obesity type Rachael [name] is currently in the action stage of change and her goal is to continue with weight loss efforts. I recommend Rachael [name] begin the structured treatment plan as follows:  She has agreed to the [employer] with sarding substitute.  Exercise goals: No exercise has been prescribed at this time.   Behavioral modification strategies: increasing lean protein intake, meal planning and cooking strategies, and keeping healthy foods in the home.  She was informed of the importance of frequent follow-up visits to maximize her success with intensive lifestyle modifications for her multiple health conditions. She was informed we would discuss her lab results at her next visit unless there is a critical issue that needs to be addressed sooner. Rachael [name] agreed to keep her next visit at the agreed upon time to discuss these results.  Objective:   Blood pressure 126/78, pulse 100, temperature 98.4 F (36.9 C), height 5\' 2"  (1.575 m), weight 181 lb (82.1 kg), last menstrual period 09/22/2012, SpO2 99 %. Body mass index is 33.11 kg/m.  EKG: Normal sinus rhythm, rate 87 bpm.  Indirect Calorimeter completed today shows a VO2 of 188 ml and a REE of 1296.  Her calculated basal metabolic rate is 11/22/2012 thus her basal metabolic rate is worse than expected.  General: Cooperative, alert, well developed, in no acute distress. HEENT: Conjunctivae  and lids unremarkable. Cardiovascular: Regular rhythm.  Lungs: Normal work of breathing. Neurologic: No focal deficits.   Lab Results  Component Value Date   CREATININE 0.56 (L) 12/27/2021   BUN 6 (L) 12/27/2021   NA 142 12/27/2021   K 3.7 12/27/2021   CL 103 12/27/2021   CO2 24 12/27/2021   Lab Results  Component Value Date   ALT 22 12/27/2021   AST 21 12/27/2021   ALKPHOS 79 12/27/2021   BILITOT 0.3 12/27/2021   Lab Results  Component Value Date   HGBA1C 6.1 (H) 12/27/2021   Lab Results  Component Value Date   INSULIN 12.1 12/27/2021   Lab Results  Component Value Date   TSH 1.450 12/27/2021   Lab Results  Component Value Date   CHOL 143 12/27/2021   HDL 50 12/27/2021   LDLCALC 78 12/27/2021   TRIG 79 12/27/2021   Lab Results  Component Value Date   WBC 4.5 12/27/2021   HGB 12.0 12/27/2021   HCT 39.0 12/27/2021   MCV 87 12/27/2021   PLT 213 12/27/2021   No results found for: "IRON", "TIBC", "FERRITIN"  Attestation Statements:   Reviewed by clinician on day  of visit: allergies, medications, problem list, medical history, surgical history, family history, social history, and previous encounter notes.  Time spent on visit including pre-visit chart review and post-visit charting and care was 40 minutes.   I, Fortino Sic, RMA am acting as transcriptionist for Reuben Likes, MD.  I have reviewed the above documentation for accuracy and completeness, and I agree with the above. - Reuben Likes, MD

## 2022-01-24 NOTE — Progress Notes (Signed)
Chief Complaint:   OBESITY Rachael [name] is here to discuss her progress with her obesity treatment plan along with follow-up of her obesity related diagnoses. Rachael [name] is on the Pescatarian Plan and states she is following her eating plan approximately 80% of the time. Rachael [name] states she is walking/jogging for 60 minutes 5 times per week and cleaning for 2 hours 1 time per week.  Today's visit was #: 2 Starting weight: 181 lbs Starting date: 12/27/21 Today's weight: 180 lbs Today's date: 01/10/22 Total lbs lost to date: 1 Total lbs lost since last in-office visit: 1  Interim History: First 4 days were hard to get all of the food in.  About 5 days in she is feeling like she could get all of the food in more comfortably.  Doing 4 ounces at lunch and 4 ounces at supper.  Eating cottage cheese for snack or an apple or peach.  Did not eat any traditional food for Thanksgiving.  Planning on routine life for December.  Going to [place] for birthday party this weekend.  Subjective:   1. Vitamin D deficiency Not on vitamin D.  Reports fatigue. Vitamin D level of 29.8. Discussed lab results with patient today.  2. Prediabetes A1c 6.1.  Insulin 12.1. Not a new diagnosis. Discussed lab results with patient today.  3. Other hyperlipidemia On Crestor 10 mg. LDL 78, HDL 50, triglycerides 79. Discussed lab results with patient today.  4. Other fatigue Saw pulmonology yesterday. Will be doing home sleep test.   Assessment/Plan:   1. Vitamin D deficiency - Vitamin D, Ergocalciferol, (DRISDOL) 1.25 MG (50000 UNIT) CAPS capsule; Take 1 capsule (50,000 Units total) by mouth every 7 (seven) days.  Dispense: 4 capsule; Refill: 0  2. Prediabetes Pathophysiology of insulin resistance, prediabetes and diabetes discussed today.  No medications at this time.  Consider meds if cravings increase her weight does not decrease.  3. Other hyperlipidemia Continue Crestor without change to dose or  medication.  4. Other fatigue Follow-up with patient after completing sleep test.  5. Obesity with current BMI of 33.0 Rachael [name] is currently in the action stage of change. As such, her goal is to continue with weight loss efforts. She has agreed to the Category 1 Plan plus 100 calories.  Exercise goals: All adults should avoid inactivity. Some physical activity is better than none, and adults who participate in any amount of physical activity gain some health benefits.  Behavioral modification strategies: increasing lean protein intake, meal planning and cooking strategies, keeping healthy foods in the home, and planning for success.  Rachael [name] has agreed to follow-up with our clinic in 2-3 weeks. She was informed of the importance of frequent follow-up visits to maximize her success with intensive lifestyle modifications for her multiple health conditions.    Objective:   Blood pressure 122/80, pulse 78, temperature 98.5 F (36.9 C), height 5\' 2"  (1.575 m), weight 180 lb (81.6 kg), last menstrual period 09/22/2012, SpO2 96 %. Body mass index is 32.92 kg/m.  General: Cooperative, alert, well developed, in no acute distress. HEENT: Conjunctivae and lids unremarkable. Cardiovascular: Regular rhythm.  Lungs: Normal work of breathing. Neurologic: No focal deficits.   Lab Results  Component Value Date   CREATININE 0.56 (L) 12/27/2021   BUN 6 (L) 12/27/2021   NA 142 12/27/2021   K 3.7 12/27/2021   CL 103 12/27/2021   CO2 24 12/27/2021   Lab Results  Component Value Date   ALT 22 12/27/2021   AST  21 12/27/2021   ALKPHOS 79 12/27/2021   BILITOT 0.3 12/27/2021   Lab Results  Component Value Date   HGBA1C 6.1 (H) 12/27/2021   Lab Results  Component Value Date   INSULIN 12.1 12/27/2021   Lab Results  Component Value Date   TSH 1.450 12/27/2021   Lab Results  Component Value Date   CHOL 143 12/27/2021   HDL 50 12/27/2021   LDLCALC 78 12/27/2021   TRIG 79 12/27/2021   Lab  Results  Component Value Date   VD25OH 29.8 (L) 12/27/2021   Lab Results  Component Value Date   WBC 4.5 12/27/2021   HGB 12.0 12/27/2021   HCT 39.0 12/27/2021   MCV 87 12/27/2021   PLT 213 12/27/2021   No results found for: "IRON", "TIBC", "FERRITIN"   Attestation Statements:   Reviewed by clinician on day of visit: allergies, medications, problem list, medical history, surgical history, family history, social history, and previous encounter notes.  I, Dawn Whitmire, FNP-C, am acting as Energy manager for Reuben Likes, MD. Total time spent on pre chart review, visit and post visit charting was 45 minutes. I have reviewed the above documentation for accuracy and completeness, and I agree with the above. - Reuben Likes, MD

## 2022-01-31 ENCOUNTER — Ambulatory Visit (INDEPENDENT_AMBULATORY_CARE_PROVIDER_SITE_OTHER): Payer: 59 | Admitting: Family Medicine

## 2022-01-31 ENCOUNTER — Encounter (INDEPENDENT_AMBULATORY_CARE_PROVIDER_SITE_OTHER): Payer: Self-pay | Admitting: Family Medicine

## 2022-01-31 VITALS — BP 124/74 | HR 90 | Temp 98.7°F | Ht 62.0 in | Wt 182.0 lb

## 2022-01-31 DIAGNOSIS — E559 Vitamin D deficiency, unspecified: Secondary | ICD-10-CM | POA: Diagnosis not present

## 2022-01-31 DIAGNOSIS — Z6833 Body mass index (BMI) 33.0-33.9, adult: Secondary | ICD-10-CM

## 2022-01-31 DIAGNOSIS — E669 Obesity, unspecified: Secondary | ICD-10-CM | POA: Diagnosis not present

## 2022-01-31 DIAGNOSIS — R7303 Prediabetes: Secondary | ICD-10-CM | POA: Diagnosis not present

## 2022-01-31 MED ORDER — VITAMIN D (ERGOCALCIFEROL) 1.25 MG (50000 UNIT) PO CAPS: 50000.0000 [IU] | ORAL_CAPSULE | ORAL | 0 refills | Status: DC

## 2022-02-18 NOTE — Progress Notes (Signed)
Chief Complaint:   OBESITY Rachael [name] is here to discuss her progress with her obesity treatment plan along with follow-up of her obesity related diagnoses. Rachael [name] is on the Category 1 Plan+100 and states she is following her eating plan approximately 90% of the time. Rachael [name] states she is walking 60 minutes 5 times per week.  Today's visit was #: 3 Starting weight: 181 lbs Starting date: 12/27/2021 Today's weight: 182 lbs Today's date: 01/31/2022 Total lbs lost to date: 1 lb Total lbs lost since last in-office visit: 0  Interim History: Rachael [name] has been staying on plan as best as she can.  She is getting hungry after breakfast.  Doing a salad at lunch.  Still feeling full after dinner.  Staying home for the next few weeks with no big plans for the end of the year.  Subjective:   1. Prediabetes A1c at 6.1, insulin at 12.1.  Not on medications.  2. Vitamin D deficiency Rachael [name] is currently taking prescription Vit D 50,000 IU once a week. Denies any nausea, vomiting or muscle weakness. She notes fatigue.  Assessment/Plan:   1. Prediabetes Will repeat labs in 2 months.  2. Vitamin D deficiency We will refill Vit D 50K IU once a week for 1 month with 0 refills.  -Refill Vitamin D, Ergocalciferol, (DRISDOL) 1.25 MG (50000 UNIT) CAPS capsule; Take 1 capsule (50,000 Units total) by mouth every 7 (seven) days.  Dispense: 4 capsule; Refill: 0  3. Obesity with current BMI of 33.4 Dailey is currently in the action stage of change. As such, her goal is to continue with weight loss efforts. She has agreed to the Category 2 Plan.   Exercise goals: No exercise has been prescribed at this time.  Behavioral modification strategies: increasing lean protein intake, meal planning and cooking strategies, keeping healthy foods in the home, holiday eating strategies , and planning for success.  Rachael [name] has agreed to follow-up with our clinic in 3 weeks. She was informed of the importance of frequent  follow-up visits to maximize her success with intensive lifestyle modifications for her multiple health conditions.   Objective:   Blood pressure 124/74, pulse 90, temperature 98.7 F (37.1 C), height 5\' 2"  (1.575 m), weight 182 lb (82.6 kg), last menstrual period 09/22/2012, SpO2 100 %. Body mass index is 33.29 kg/m.  General: Cooperative, alert, well developed, in no acute distress. HEENT: Conjunctivae and lids unremarkable. Cardiovascular: Regular rhythm.  Lungs: Normal work of breathing. Neurologic: No focal deficits.   Lab Results  Component Value Date   CREATININE 0.56 (L) 12/27/2021   BUN 6 (L) 12/27/2021   NA 142 12/27/2021   K 3.7 12/27/2021   CL 103 12/27/2021   CO2 24 12/27/2021   Lab Results  Component Value Date   ALT 22 12/27/2021   AST 21 12/27/2021   ALKPHOS 79 12/27/2021   BILITOT 0.3 12/27/2021   Lab Results  Component Value Date   HGBA1C 6.1 (H) 12/27/2021   Lab Results  Component Value Date   INSULIN 12.1 12/27/2021   Lab Results  Component Value Date   TSH 1.450 12/27/2021   Lab Results  Component Value Date   CHOL 143 12/27/2021   HDL 50 12/27/2021   LDLCALC 78 12/27/2021   TRIG 79 12/27/2021   Lab Results  Component Value Date   VD25OH 29.8 (L) 12/27/2021   Lab Results  Component Value Date   WBC 4.5 12/27/2021   HGB 12.0 12/27/2021   HCT 39.0  12/27/2021   MCV 87 12/27/2021   PLT 213 12/27/2021   No results found for: "IRON", "TIBC", "FERRITIN"  Attestation Statements:   Reviewed by clinician on day of visit: allergies, medications, problem list, medical history, surgical history, family history, social history, and previous encounter notes.  I, Elnora Morrison, RMA am acting as transcriptionist for Coralie Common, MD.  I have reviewed the above documentation for accuracy and completeness, and I agree with the above. - Coralie Common, MD

## 2022-02-21 ENCOUNTER — Ambulatory Visit (INDEPENDENT_AMBULATORY_CARE_PROVIDER_SITE_OTHER): Payer: 59 | Admitting: Family Medicine

## 2022-02-21 ENCOUNTER — Encounter (INDEPENDENT_AMBULATORY_CARE_PROVIDER_SITE_OTHER): Payer: Self-pay | Admitting: Family Medicine

## 2022-02-21 VITALS — BP 130/76 | HR 102 | Temp 98.8°F | Ht 62.0 in | Wt 183.0 lb

## 2022-02-21 DIAGNOSIS — E559 Vitamin D deficiency, unspecified: Secondary | ICD-10-CM | POA: Diagnosis not present

## 2022-02-21 DIAGNOSIS — Z6833 Body mass index (BMI) 33.0-33.9, adult: Secondary | ICD-10-CM | POA: Diagnosis not present

## 2022-02-21 DIAGNOSIS — E669 Obesity, unspecified: Secondary | ICD-10-CM | POA: Diagnosis not present

## 2022-02-21 DIAGNOSIS — R7303 Prediabetes: Secondary | ICD-10-CM | POA: Diagnosis not present

## 2022-02-21 MED ORDER — VITAMIN D (ERGOCALCIFEROL) 1.25 MG (50000 UNIT) PO CAPS: 50000.0000 [IU] | ORAL_CAPSULE | ORAL | 0 refills | Status: DC

## 2022-03-04 NOTE — Progress Notes (Signed)
Chief Complaint:   OBESITY Rachael [name] is here to discuss her progress with her obesity treatment plan along with follow-up of her obesity related diagnoses. Rachael [name] is on the Category 2 Plan and states she is following her eating plan approximately 60% of the time. Rachael [name] states she is jogging/walking 10 minutes 7 times per week.  Today's visit was #: 4 Starting weight: 181 lbs Starting date: 12/27/2021 Today's weight: 183 lbs Today's date: 02/21/2022 Total lbs lost to date: 0 lbs  Total lbs lost since last in-office visit: 0  Interim History: Rachael [name] ran out of money and could not afford to get more food.  She has been stretching food she has until payday.  She is trying to be mindful of places to eat and buy food from.  No upcoming plans or events.  Subjective:   1. Prediabetes Rachael [name]'s last A1c was in prediabetic range.  Notes carb cravings and intake.  2. Vitamin D deficiency Rachael [name] is currently taking prescription Vit D 50,000 IU once a week.  Last level low.  Assessment/Plan:   1. Prediabetes Follow up labs in 2-3 months.  2. Vitamin D deficiency We will refill Vit D 50K IU once a week for 1 month with 0 refills.  -Refill Vitamin D, Ergocalciferol, (DRISDOL) 1.25 MG (50000 UNIT) CAPS capsule; Take 1 capsule (50,000 Units total) by mouth every 7 (seven) days.  Dispense: 12 capsule; Refill: 0  3. Obesity with current BMI of 33.5 Rachael [name] is currently in the action stage of change. As such, her goal is to continue with weight loss efforts. She has agreed to the Category 2 Plan.   Exercise goals: All adults should avoid inactivity. Some physical activity is better than none, and adults who participate in any amount of physical activity gain some health benefits.  Behavioral modification strategies: increasing lean protein intake, meal planning and cooking strategies, keeping healthy foods in the home, and planning for success.  Rachael [name] has agreed to follow-up with our clinic in 3  weeks. She was informed of the importance of frequent follow-up visits to maximize her success with intensive lifestyle modifications for her multiple health conditions.   Objective:   Blood pressure 130/76, pulse (!) 102, temperature 98.8 F (37.1 C), height 5\' 2"  (1.575 m), weight 183 lb (83 kg), last menstrual period 09/22/2012, SpO2 99 %. Body mass index is 33.47 kg/m.  General: Cooperative, alert, well developed, in no acute distress. HEENT: Conjunctivae and lids unremarkable. Cardiovascular: Regular rhythm.  Lungs: Normal work of breathing. Neurologic: No focal deficits.   Lab Results  Component Value Date   CREATININE 0.56 (L) 12/27/2021   BUN 6 (L) 12/27/2021   NA 142 12/27/2021   K 3.7 12/27/2021   CL 103 12/27/2021   CO2 24 12/27/2021   Lab Results  Component Value Date   ALT 22 12/27/2021   AST 21 12/27/2021   ALKPHOS 79 12/27/2021   BILITOT 0.3 12/27/2021   Lab Results  Component Value Date   HGBA1C 6.1 (H) 12/27/2021   Lab Results  Component Value Date   INSULIN 12.1 12/27/2021   Lab Results  Component Value Date   TSH 1.450 12/27/2021   Lab Results  Component Value Date   CHOL 143 12/27/2021   HDL 50 12/27/2021   LDLCALC 78 12/27/2021   TRIG 79 12/27/2021   Lab Results  Component Value Date   VD25OH 29.8 (L) 12/27/2021   Lab Results  Component Value Date   WBC 4.5 12/27/2021  HGB 12.0 12/27/2021   HCT 39.0 12/27/2021   MCV 87 12/27/2021   PLT 213 12/27/2021   No results found for: "IRON", "TIBC", "FERRITIN"  Attestation Statements:   Reviewed by clinician on day of visit: allergies, medications, problem list, medical history, surgical history, family history, social history, and previous encounter notes.  I, Elnora Morrison, RMA am acting as transcriptionist for Coralie Common, MD.  I have reviewed the above documentation for accuracy and completeness, and I agree with the above. - Coralie Common, MD

## 2022-03-07 ENCOUNTER — Ambulatory Visit (INDEPENDENT_AMBULATORY_CARE_PROVIDER_SITE_OTHER): Payer: 59 | Admitting: Family Medicine

## 2022-03-14 ENCOUNTER — Ambulatory Visit (INDEPENDENT_AMBULATORY_CARE_PROVIDER_SITE_OTHER): Payer: 59 | Admitting: Family Medicine

## 2022-03-14 ENCOUNTER — Encounter (INDEPENDENT_AMBULATORY_CARE_PROVIDER_SITE_OTHER): Payer: Self-pay | Admitting: Family Medicine

## 2022-03-14 VITALS — BP 111/74 | HR 85 | Temp 98.6°F | Ht 62.0 in | Wt 184.0 lb

## 2022-03-14 DIAGNOSIS — E669 Obesity, unspecified: Secondary | ICD-10-CM

## 2022-03-14 DIAGNOSIS — Z6833 Body mass index (BMI) 33.0-33.9, adult: Secondary | ICD-10-CM | POA: Diagnosis not present

## 2022-03-14 DIAGNOSIS — E559 Vitamin D deficiency, unspecified: Secondary | ICD-10-CM | POA: Diagnosis not present

## 2022-03-14 NOTE — Progress Notes (Deleted)
Patient feels she is doing well on breakfast and lunch and then struggles with food intake in the evening.  She mentions that she has tried redistributing the vegetables at dinner to earlier in the am and then trying to get the protein in only.  Often getting around 4oz and some vegetable in.  She feels so full that dinner is a harder task to get in.  Is wondering how she can better get all the food in.  Only snacks are the apple and string cheese.  Next few weeks she doesn't have any upcoming plans.  Plan: discussed adding additional protein in during the earlier part of the day in the form of an extra slice of cheese and an additional string cheese.  She is wondering about substituting cous cous for bread calories.

## 2022-03-26 NOTE — Progress Notes (Signed)
Chief Complaint:   OBESITY Rachael [name] is here to discuss her progress with her obesity treatment plan along with follow-up of her obesity related diagnoses. Rachael [name] is on the Category 2 Plan and states she is following her eating plan approximately 85% of the time. Rachael [name] states she is trainer/gym 60 minutes 2 times per week.  Today's visit was #: 5 Starting weight: 181 lbs Starting date: 12/27/2021 Today's weight: 184 lbs Today's date: 03/14/2022 Total lbs lost to date: 0 lbs Total lbs lost since last in-office visit: 0  Interim History: Patient feels she is doing well on breakfast and lunch and then struggles with food intake in the evening.  She mentions that she has tried redistributing the vegetables at dinner to earlier in the am and then trying to get the protein in only.  Often getting around 4oz and some vegetable in.  She feels so full that dinner is a harder task to get in.  Is wondering how she can better get all the food in.  Only snacks are the apple and string cheese.  Next few weeks she doesn't have any upcoming plans.   Subjective:   1. Vitamin D deficiency Rachael [name] is currently taking prescription Vit D 50,000 IU once a week.   Notes fatigue.  Last Vit D level of 29.8.  Assessment/Plan:   1. Vitamin D deficiency [place] Vit D 50K IU once a week.  2. Obesity with current BMI of 33.8 Rachael [name] is currently in the action stage of change. As such, her goal is to continue with weight loss efforts. She has agreed to the Category 2 Plan and the [place].   Exercise goals: As is.  Discussed adding additional protein in during the earlier part of the day in the form of an extra slice of cheese and an additional string cheese.  She is wondering about substituting cous cous for bread calories.  Behavioral modification strategies: increasing lean protein intake, meal planning and cooking strategies, keeping healthy foods in the home, and planning for success.  Rachael [name] has  agreed to follow-up with our clinic in 3 weeks. She was informed of the importance of frequent follow-up visits to maximize her success with intensive lifestyle modifications for her multiple health conditions.   Objective:   Blood pressure 111/74, pulse 85, temperature 98.6 F (37 C), height '5\' 2"'$  (1.575 m), weight 184 lb (83.5 kg), last menstrual period 09/22/2012, SpO2 98 %. Body mass index is 33.65 kg/m.  General: Cooperative, alert, well developed, in no acute distress. HEENT: Conjunctivae and lids unremarkable. Cardiovascular: Regular rhythm.  Lungs: Normal work of breathing. Neurologic: No focal deficits.   Lab Results  Component Value Date   CREATININE 0.56 (L) 12/27/2021   BUN 6 (L) 12/27/2021   NA 142 12/27/2021   K 3.7 12/27/2021   CL 103 12/27/2021   CO2 24 12/27/2021   Lab Results  Component Value Date   ALT 22 12/27/2021   AST 21 12/27/2021   ALKPHOS 79 12/27/2021   BILITOT 0.3 12/27/2021   Lab Results  Component Value Date   HGBA1C 6.1 (H) 12/27/2021   Lab Results  Component Value Date   INSULIN 12.1 12/27/2021   Lab Results  Component Value Date   TSH 1.450 12/27/2021   Lab Results  Component Value Date   CHOL 143 12/27/2021   HDL 50 12/27/2021   LDLCALC 78 12/27/2021   TRIG 79 12/27/2021   Lab Results  Component Value Date   VD25OH  29.8 (L) 12/27/2021   Lab Results  Component Value Date   WBC 4.5 12/27/2021   HGB 12.0 12/27/2021   HCT 39.0 12/27/2021   MCV 87 12/27/2021   PLT 213 12/27/2021   No results found for: "IRON", "TIBC", "FERRITIN"  Attestation Statements:   Reviewed by clinician on day of visit: allergies, medications, problem list, medical history, surgical history, family history, social history, and previous encounter notes.  I, Elnora Morrison, RMA am acting as transcriptionist for Coralie Common, MD.  I have reviewed the above documentation for accuracy and completeness, and I agree with the above. - Coralie Common, MD

## 2022-04-08 ENCOUNTER — Ambulatory Visit (INDEPENDENT_AMBULATORY_CARE_PROVIDER_SITE_OTHER): Payer: 59 | Admitting: Family Medicine

## 2022-04-08 ENCOUNTER — Encounter (INDEPENDENT_AMBULATORY_CARE_PROVIDER_SITE_OTHER): Payer: Self-pay | Admitting: Family Medicine

## 2022-04-08 VITALS — BP 122/76 | HR 91 | Temp 97.8°F | Ht 62.0 in | Wt 182.0 lb

## 2022-04-08 DIAGNOSIS — R7303 Prediabetes: Secondary | ICD-10-CM

## 2022-04-08 DIAGNOSIS — E559 Vitamin D deficiency, unspecified: Secondary | ICD-10-CM

## 2022-04-08 DIAGNOSIS — E669 Obesity, unspecified: Secondary | ICD-10-CM | POA: Diagnosis not present

## 2022-04-08 DIAGNOSIS — Z6833 Body mass index (BMI) 33.0-33.9, adult: Secondary | ICD-10-CM

## 2022-04-08 MED ORDER — VITAMIN D (ERGOCALCIFEROL) 1.25 MG (50000 UNIT) PO CAPS: 50000.0000 [IU] | ORAL_CAPSULE | ORAL | 0 refills | Status: DC

## 2022-04-08 NOTE — Progress Notes (Deleted)
Since last appointment she has been following the meal plan for morning and afternoon.  Eats what she can in the evening in order to not feel so full.  Dinner is salad with salmon and a bit of cottage cheese. She is getting up to the full 8oz with a combo of meat protein and cottage cheese.  She has switched the dinner and lunch meals to get the full amount in.  She is feeling better and is able to get all the food in now.  Next few weeks she has a function on March 9th.  Denies any changes needing to be made to the plan.  She is working out with a trainer 2x a week and then walking on a treadmill 3x a week and on the weekends she is doing errands, housework and running the neighborhood.

## 2022-04-13 ENCOUNTER — Other Ambulatory Visit: Payer: Self-pay | Admitting: Primary Care

## 2022-04-22 NOTE — Progress Notes (Signed)
Chief Complaint:   OBESITY Rachael [name] is here to discuss her progress with her obesity treatment plan along with follow-up of her obesity related diagnoses. Rachael [name] is on the Category 2 Plan and the Rachael [name] and states she is following her eating plan approximately 100% of the time. Rachael [name] states she is walking 15,000 steps 7 days/week.  Today's visit was #: 6 Starting weight: 181 LBS Starting date: 12/27/2021 Today's weight: 182 LBS Today's date: 04/08/2022 Total lbs lost to date: 0 Total lbs lost since last in-office visit: 2 LBS Interim History:  Since last appointment she has been following the meal plan for morning and afternoon.  Eats what she can in the evening in order to not feel so full.  Dinner is salad with salmon and a bit of cottage cheese. She is getting up to the full 8oz with a combo of meat protein and cottage cheese.  She has switched the dinner and lunch meals to get the full amount in.  She is feeling better and is able to get all the food in now.  Next few weeks she has a function on March 9th.  Denies any changes needing to be made to the plan.  She is working out with a trainer 2x a week and then walking on a treadmill 3x a week and on the weekends she is doing errands, housework and running the neighborhood.     Subjective:   1. Vitamin D deficiency Patient is on prescription vitamin D.  Patient denies nausea, vomiting, muscle weakness but is positive for fatigue.  2. Prediabetes Patient is not on any medications.  Patient's last lab was mid range prediabetic.  Assessment/Plan:   1. Vitamin D deficiency Refill- Vitamin D, Ergocalciferol, (DRISDOL) 1.25 MG (50000 UNIT) CAPS capsule; Take 1 capsule (50,000 Units total) by mouth every 7 (seven) days.  Dispense: 12 capsule; Refill: 0  2. Prediabetes Recheck labs in 1 to 2 months.  3. Obesity with current BMI of 33.3 Rachael [name] is currently in the action stage of change. As such, her goal is to continue with  weight loss efforts. She has agreed to the Category 2 Plan and the [place].   Exercise goals: All adults should avoid inactivity. Some physical activity is better than none, and adults who participate in any amount of physical activity gain some health benefits.  Behavioral modification strategies: increasing lean protein intake, meal planning and cooking strategies, keeping healthy foods in the home, and planning for success.  Rachael [name] has agreed to follow-up with our clinic in 3-4 weeks. She was informed of the importance of frequent follow-up visits to maximize her success with intensive lifestyle modifications for her multiple health conditions.   Objective:   Blood pressure 122/76, pulse 91, temperature 97.8 F (36.6 C), height '5\' 2"'$  (1.575 m), weight 182 lb (82.6 kg), last menstrual period 09/22/2012, SpO2 98 %. Body mass index is 33.29 kg/m.  General: Cooperative, alert, well developed, in no acute distress. HEENT: Conjunctivae and lids unremarkable. Cardiovascular: Regular rhythm.  Lungs: Normal work of breathing. Neurologic: No focal deficits.   Lab Results  Component Value Date   CREATININE 0.56 (L) 12/27/2021   BUN 6 (L) 12/27/2021   NA 142 12/27/2021   K 3.7 12/27/2021   CL 103 12/27/2021   CO2 24 12/27/2021   Lab Results  Component Value Date   ALT 22 12/27/2021   AST 21 12/27/2021   ALKPHOS 79 12/27/2021   BILITOT 0.3 12/27/2021  Lab Results  Component Value Date   HGBA1C 6.1 (H) 12/27/2021   Lab Results  Component Value Date   INSULIN 12.1 12/27/2021   Lab Results  Component Value Date   TSH 1.450 12/27/2021   Lab Results  Component Value Date   CHOL 143 12/27/2021   HDL 50 12/27/2021   LDLCALC 78 12/27/2021   TRIG 79 12/27/2021   Lab Results  Component Value Date   VD25OH 29.8 (L) 12/27/2021   Lab Results  Component Value Date   WBC 4.5 12/27/2021   HGB 12.0 12/27/2021   HCT 39.0 12/27/2021   MCV 87 12/27/2021   PLT 213  12/27/2021   No results found for: "IRON", "TIBC", "FERRITIN"  Attestation Statements:   Reviewed by clinician on day of visit: allergies, medications, problem list, medical history, surgical history, family history, social history, and previous encounter notes.  I, Davy Pique, RMA, am acting as transcriptionist for Coralie Common, MD.  I have reviewed the above documentation for accuracy and completeness, and I agree with the above. - Coralie Common, MD

## 2022-04-23 ENCOUNTER — Other Ambulatory Visit (INDEPENDENT_AMBULATORY_CARE_PROVIDER_SITE_OTHER): Payer: Self-pay | Admitting: Family Medicine

## 2022-04-23 DIAGNOSIS — E559 Vitamin D deficiency, unspecified: Secondary | ICD-10-CM

## 2022-05-08 ENCOUNTER — Encounter (INDEPENDENT_AMBULATORY_CARE_PROVIDER_SITE_OTHER): Payer: Self-pay | Admitting: Family Medicine

## 2022-05-08 ENCOUNTER — Ambulatory Visit (INDEPENDENT_AMBULATORY_CARE_PROVIDER_SITE_OTHER): Payer: 59 | Admitting: Family Medicine

## 2022-05-08 ENCOUNTER — Other Ambulatory Visit: Payer: Self-pay | Admitting: Primary Care

## 2022-05-08 ENCOUNTER — Other Ambulatory Visit (INDEPENDENT_AMBULATORY_CARE_PROVIDER_SITE_OTHER): Payer: Self-pay | Admitting: Family Medicine

## 2022-05-08 VITALS — BP 121/77 | HR 85 | Temp 97.7°F | Ht 62.0 in | Wt 182.0 lb

## 2022-05-08 DIAGNOSIS — E669 Obesity, unspecified: Secondary | ICD-10-CM

## 2022-05-08 DIAGNOSIS — Z6833 Body mass index (BMI) 33.0-33.9, adult: Secondary | ICD-10-CM | POA: Diagnosis not present

## 2022-05-08 DIAGNOSIS — E7849 Other hyperlipidemia: Secondary | ICD-10-CM | POA: Diagnosis not present

## 2022-05-08 DIAGNOSIS — E559 Vitamin D deficiency, unspecified: Secondary | ICD-10-CM

## 2022-05-08 MED ORDER — VITAMIN D (ERGOCALCIFEROL) 1.25 MG (50000 UNIT) PO CAPS: 50000.0000 [IU] | ORAL_CAPSULE | ORAL | 0 refills | Status: DC

## 2022-05-08 MED ORDER — WEGOVY 0.25 MG/0.5ML ~~LOC~~ SOAJ: 0.2500 mg | SUBCUTANEOUS | 0 refills | Status: DC

## 2022-05-08 NOTE — Progress Notes (Signed)
Chief Complaint:   OBESITY Rachael [name] is here to discuss her progress with her obesity treatment plan along with follow-up of her obesity related diagnoses. Rachael [name] is on the Category 2 Plan and the Rachael [name] and states she is following her eating plan approximately 100% of the time. Rachael [name] states she is not exercising.  Today's visit was #: 7 Starting weight: 181 LBS Starting date: 12/27/2021 Today's weight: 182 LBS Today's date: 05/08/2022 Total lbs lost to date: 0 Total lbs lost since last in-office visit: 0  Interim History: Since last appointment she did a walk/run of a 5k.  She is dealing with prolonged BV currently and is on tinidazole.  She also had an ultrasound on her uterus done.  Food wise she is still eating quite a bit.  She is trying to eat more in the am and early afternoon.  She is eating sardines with bread and lettuce for supper.  She voices she doesn't have much coming up for herself in terms of events or activities.   Subjective:   1. Other hyperlipidemia Patient is on a statin due to elevated LDL.  Patient denies side effects of statin.  2. Vitamin D deficiency Patient is on prescription vitamin D.  Patient denies nausea, vomiting, muscle weakness.  Last vitamin D level was 29.8.  Assessment/Plan:   1. Other hyperlipidemia Start- Semaglutide-Weight Management (WEGOVY) 0.25 MG/0.5ML SOAJ; Inject 0.25 mg into the skin once a week.  Dispense: 2 mL; Refill: 0  2. Vitamin D deficiency Refill- Vitamin D, Ergocalciferol, (DRISDOL) 1.25 MG (50000 UNIT) CAPS capsule; Take 1 capsule (50,000 Units total) by mouth every 7 (seven) days.  Dispense: 12 capsule; Refill: 0  3. Obesity with current BMI of 33.4 Rachael [name] is currently in the action stage of change. As such, her goal is to continue with weight loss efforts. She has agreed to the Category 2 Plan and the [place].   Plan: patient to track total calories on either My Fitness Pal or Lose It to see where she  is ending up calorie and protein wise over the course of the day.  Total calories of 1000-1100 calories and 80 or more grams of protein for the day.    Exercise goals: All adults should avoid inactivity. Some physical activity is better than none, and adults who participate in any amount of physical activity gain some health benefits.  Behavioral modification strategies: increasing lean protein intake, meal planning and cooking strategies, keeping healthy foods in the home, and planning for success.  Rachael [name] has agreed to follow-up with our clinic in 3 weeks. She was informed of the importance of frequent follow-up visits to maximize her success with intensive lifestyle modifications for her multiple health conditions.   Objective:   Blood pressure 121/77, pulse 85, temperature 97.7 F (36.5 C), height 5\' 2"  (1.575 m), weight 182 lb (82.6 kg), last menstrual period 09/22/2012, SpO2 99 %. Body mass index is 33.29 kg/m.  General: Cooperative, alert, well developed, in no acute distress. HEENT: Conjunctivae and lids unremarkable. Cardiovascular: Regular rhythm.  Lungs: Normal work of breathing. Neurologic: No focal deficits.   Lab Results  Component Value Date   CREATININE 0.56 (L) 12/27/2021   BUN 6 (L) 12/27/2021   NA 142 12/27/2021   K 3.7 12/27/2021   CL 103 12/27/2021   CO2 24 12/27/2021   Lab Results  Component Value Date   ALT 22 12/27/2021   AST 21 12/27/2021   ALKPHOS 79 12/27/2021  BILITOT 0.3 12/27/2021   Lab Results  Component Value Date   HGBA1C 6.1 (H) 12/27/2021   Lab Results  Component Value Date   INSULIN 12.1 12/27/2021   Lab Results  Component Value Date   TSH 1.450 12/27/2021   Lab Results  Component Value Date   CHOL 143 12/27/2021   HDL 50 12/27/2021   LDLCALC 78 12/27/2021   TRIG 79 12/27/2021   Lab Results  Component Value Date   VD25OH 29.8 (L) 12/27/2021   Lab Results  Component Value Date   WBC 4.5 12/27/2021   HGB 12.0 12/27/2021    HCT 39.0 12/27/2021   MCV 87 12/27/2021   PLT 213 12/27/2021   No results found for: "IRON", "TIBC", "FERRITIN"  Attestation Statements:   Reviewed by clinician on day of visit: allergies, medications, problem list, medical history, surgical history, family history, social history, and previous encounter notes.  I, Davy Pique, RMA, am acting as transcriptionist for Coralie Common, MD.  I have reviewed the above documentation for accuracy and completeness, and I agree with the above. - Coralie Common, MD

## 2022-05-09 ENCOUNTER — Encounter (INDEPENDENT_AMBULATORY_CARE_PROVIDER_SITE_OTHER): Payer: Self-pay

## 2022-05-14 ENCOUNTER — Telehealth (INDEPENDENT_AMBULATORY_CARE_PROVIDER_SITE_OTHER): Payer: Self-pay

## 2022-05-14 NOTE — Telephone Encounter (Signed)
Attempted to do the PA twice by phone, and was not able to get through the automated system. I kept getting a recording telling me the office was not open, even though it was during business hours.

## 2022-05-15 ENCOUNTER — Encounter (INDEPENDENT_AMBULATORY_CARE_PROVIDER_SITE_OTHER): Payer: Self-pay

## 2022-05-15 NOTE — Telephone Encounter (Signed)
Pt can get otc

## 2022-05-22 NOTE — Telephone Encounter (Signed)
Do not see RX info on [facility] card. [facility] sent fax authorization and it was faxed 05/22/22. CS

## 2022-05-23 ENCOUNTER — Encounter (INDEPENDENT_AMBULATORY_CARE_PROVIDER_SITE_OTHER): Payer: Self-pay

## 2022-05-27 ENCOUNTER — Ambulatory Visit (INDEPENDENT_AMBULATORY_CARE_PROVIDER_SITE_OTHER): Payer: 59 | Admitting: Primary Care

## 2022-05-27 DIAGNOSIS — G4733 Obstructive sleep apnea (adult) (pediatric): Secondary | ICD-10-CM

## 2022-05-27 DIAGNOSIS — R0683 Snoring: Secondary | ICD-10-CM

## 2022-05-31 ENCOUNTER — Other Ambulatory Visit: Payer: Self-pay | Admitting: Primary Care

## 2022-05-31 NOTE — Progress Notes (Signed)
Sleep study showed moderate-severe OSA, please have patient set up visit with me to discuss results and treatment options available

## 2022-06-05 ENCOUNTER — Other Ambulatory Visit (HOSPITAL_BASED_OUTPATIENT_CLINIC_OR_DEPARTMENT_OTHER): Payer: Self-pay

## 2022-06-05 ENCOUNTER — Ambulatory Visit (INDEPENDENT_AMBULATORY_CARE_PROVIDER_SITE_OTHER): Payer: 59 | Admitting: Physician Assistant

## 2022-06-05 ENCOUNTER — Encounter (INDEPENDENT_AMBULATORY_CARE_PROVIDER_SITE_OTHER): Payer: Self-pay | Admitting: Physician Assistant

## 2022-06-05 VITALS — BP 116/75 | HR 92 | Temp 98.3°F | Ht 62.0 in | Wt 181.0 lb

## 2022-06-05 DIAGNOSIS — E7849 Other hyperlipidemia: Secondary | ICD-10-CM

## 2022-06-05 DIAGNOSIS — R7303 Prediabetes: Secondary | ICD-10-CM

## 2022-06-05 DIAGNOSIS — E559 Vitamin D deficiency, unspecified: Secondary | ICD-10-CM

## 2022-06-05 DIAGNOSIS — Z6833 Body mass index (BMI) 33.0-33.9, adult: Secondary | ICD-10-CM | POA: Diagnosis not present

## 2022-06-05 DIAGNOSIS — Z6832 Body mass index (BMI) 32.0-32.9, adult: Secondary | ICD-10-CM | POA: Insufficient documentation

## 2022-06-05 DIAGNOSIS — E669 Obesity, unspecified: Secondary | ICD-10-CM | POA: Insufficient documentation

## 2022-06-05 MED ORDER — WEGOVY 0.25 MG/0.5ML ~~LOC~~ SOAJ
0.2500 mg | SUBCUTANEOUS | 0 refills | Status: DC
  Filled 2022-06-05 – 2022-06-07 (×2): qty 2, 28d supply, fill #0

## 2022-06-05 NOTE — Assessment & Plan Note (Signed)
Vitamin D Deficiency Vitamin D is not at goal of 50.  Most recent vitamin D level was 29.8. She is on  prescription ergocalciferol 50,000 IU weekly. Lab Results  Component Value Date   VD25OH 29.8 (L) 12/27/2021    Plan: Continue  prescription ergocalciferol 50,000 IU weekly Reports does not need refill currently.  Low vitamin D levels can be associated with adiposity and may result in leptin resistance and weight gain. Also associated with fatigue. Currently on vitamin D supplementation without any adverse effects.  Recheck vitamin D level in 2-3 months.

## 2022-06-05 NOTE — Progress Notes (Signed)
Office: [phone]  /  Fax: [phone]  WEIGHT SUMMARY AND BIOMETRICS  Vitals Temp: 98.3 F (36.8 C) BP: 116/75 Pulse Rate: 92 SpO2: 98 %   Anthropometric Measurements Height:  (1.575 m) Weight: 181 lb (82.1 kg) BMI (Calculated): 33.1 Weight at Last Visit: 182 lb Weight Lost Since Last Visit: 1 lb Weight Gained Since Last Visit: 0 lb Starting Weight: 181 lb Total Weight Loss (lbs): 0 lb (0 kg)   Body Composition  Body Fat %: 39.8 % Fat Mass (lbs): 72.2 lbs Muscle Mass (lbs): 103.6 lbs Total Body Water (lbs): 69.8 lbs Visceral Fat Rating : 11   Other Clinical Data Fasting: no Labs: no Today's Visit #: 8 Starting Date: 12/27/21     HPI  Chief Complaint: OBESITY  Rachael [name] here to discuss her progress with her obesity treatment plan. She [name] on the the Category 1 Plan and states she [name] following her eating plan approximately 90 % of the time. She states she [name] exercising/personal trainer/treadmill 45 minutes 2 times per week.   Interval History:  Since last office visit she [name] down 1 pound Hunger and appetite moderate control when gets adequate protein Journaling and focusing on getting at least 80 g of protein daily which she has been doing more consistently. Sleep-better lately but at times only getting 6 hours nightly and we discussed the importance of adequate sleep and its role in metabolism and weight loss.  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  She [name] going on a [place] cruise for a week next week and [name] excited to be able to go on vacation and we discussed some travel strategies including continuing to focus on getting adequate protein to help control hunger.   Pharmacotherapy: She has been approved for [facility] but had been unable to obtain due to supply issues and we were able to find Wegovy 0.25 mg at [facility] or [facility]  pharmacy for her and she [name] going to try to begin this.  She [name] aware of injection method and has reviewed this online. Patient denies a personal or family history of pancreatitis, medullary thyroid carcinoma or multiple endocrine neoplasia type II.    PHYSICAL EXAM:  Blood pressure 116/75, pulse 92, temperature 98.3 F (36.8 C), height  (1.575 m), weight 181 lb (82.1 kg), last menstrual period 09/22/2012, SpO2 98 %. Body mass index [name] 33.11 kg/m.  General: She [name] overweight, cooperative, alert, well developed, and in no acute distress. PSYCH: Has normal mood, affect and thought process.   Cardiovascular: Heart rate 90s, regular Lungs: Normal breathing effort, no conversational dyspnea. Neuro: Intact  DIAGNOSTIC DATA REVIEWED:  BMET    Component Value Date/Time   NA 142 12/27/2021 0920   K 3.7 12/27/2021 0920   CL 103 12/27/2021 0920   CO2 24 12/27/2021 0920   GLUCOSE 94 12/27/2021 0920   BUN 6 (L) 12/27/2021 0920   CREATININE 0.56 (L) 12/27/2021 0920   CALCIUM 9.5 12/27/2021 0920   Lab Results  Component Value Date   HGBA1C 6.1 (H) 12/27/2021   Lab Results  Component Value Date   INSULIN 12.1 12/27/2021   Lab Results  Component Value Date   TSH 1.450 12/27/2021   CBC    Component Value Date/Time   WBC 4.5 12/27/2021 0920   RBC 4.49 12/27/2021 0920   HGB 12.0 12/27/2021 0920  HCT 39.0 12/27/2021 0920   PLT 213 12/27/2021 0920   MCV 87 12/27/2021 0920   MCH 26.7 12/27/2021 0920   MCHC 30.8 (L) 12/27/2021 0920   RDW 14.0 12/27/2021 0920   Iron Studies No results found for: "IRON", "TIBC", "FERRITIN", "IRONPCTSAT" Lipid Panel     Component Value Date/Time   CHOL 143 12/27/2021 0920   TRIG 79 12/27/2021 0920   HDL 50 12/27/2021 0920   LDLCALC 78 12/27/2021 0920   Hepatic Function Panel     Component Value Date/Time   PROT 7.3 12/27/2021 0920   ALBUMIN 4.9 12/27/2021 0920   AST 21 12/27/2021 0920   ALT 22 12/27/2021 0920   ALKPHOS 79  12/27/2021 0920   BILITOT 0.3 12/27/2021 0920      Component Value Date/Time   TSH 1.450 12/27/2021 0920   Nutritional Lab Results  Component Value Date   VD25OH 29.8 (L) 12/27/2021    ASSOCIATED CONDITIONS ADDRESSED TODAY  ASSESSMENT AND PLAN  Problem List Items Addressed This Visit     Vitamin D deficiency    Vitamin D Deficiency Vitamin D [name] not at goal of 50.  Most recent vitamin D level was 29.8. She [name] on  prescription ergocalciferol 50,000 IU weekly. Lab Results  Component Value Date   VD25OH 29.8 (L) 12/27/2021   Plan: Continue  prescription ergocalciferol 50,000 IU weekly Reports does not need refill currently.  Low vitamin D levels can be associated with adiposity and may result in leptin resistance and weight gain. Also associated with fatigue. Currently on vitamin D supplementation without any adverse effects.  Recheck vitamin D level in 2-3 months.         Prediabetes - Primary    Prediabetes with polyphagia- Last A1c was 6.1/ Insulin 12.1- not at goals.   Medication(s): None She [name] working on nutrition plan to decrease simple carbohydrates, increase lean proteins and exercise to promote weight loss, improve glycemic control and prevent progression to Type 2 diabetes.   Lab Results  Component Value Date   HGBA1C 6.1 (H) 12/27/2021   Lab Results  Component Value Date   INSULIN 12.1 12/27/2021   Plan: Start Wegovy 0.25 mg SQ weekly Continue working on nutrition plan to decrease simple carbohydrates, increase lean proteins and exercise to promote weight loss, improve glycemic control and prevent progression to Type 2 diabetes.  Recheck labs in 2-3 months after begins Wegovy .       Generalized obesity- Start BMI 33.11   Relevant Medications   Semaglutide-Weight Management (WEGOVY) 0.25 MG/0.5ML SOAJ   BMI 33.0-33.9,adult Current BMI 33.2  Currrent BMI 33.2.   She [name] going to begin Wegovy 0.25 mg weekly   TREATMENT PLAN FOR  OBESITY:  Recommended Dietary Goals  Rachael [name] [name] currently in the action stage of change. As such, her goal [name] to continue weight management plan. She has agreed to the Category 1 Plan.  Behavioral Intervention  We discussed the following Behavioral Modification Strategies today: increasing lean protein intake, decreasing simple carbohydrates , increasing vegetables, avoiding skipping meals, increasing water intake, work on tracking and journaling calories using tracking application, continue to practice mindfulness when eating, and planning for success.  Additional resources provided today: NA  Recommended Physical Activity Goals  Rachael [name] has been advised to work up to 150 minutes of moderate intensity aerobic activity a week and strengthening exercises 2-3 times per week for cardiovascular health, weight loss maintenance and preservation of muscle mass.   She has agreed to [employer]  current level of physical activity    Pharmacotherapy We discussed various medication options to help Rachael [name] with her weight loss efforts and we both agreed to begin Wegovy 0.25 mg weekly and continue to work on nutritional and behavioral strategies to promote weight loss.    Return in about 3 weeks (around 06/26/2022).Marland Kitchen She was informed of the importance of frequent follow up visits to maximize her success with intensive lifestyle modifications for her multiple health conditions.   ATTESTASTION STATEMENTS:  Reviewed by clinician on day of visit: allergies, medications, problem list, medical history, surgical history, family history, social history, and previous encounter notes.   I have personally spent 33 minutes total time today in preparation, patient care, nutritional counseling and documentation for this visit, including the following: review of clinical lab tests; review of medical tests/procedures/services.      Adrianna Dudas, PA-C

## 2022-06-05 NOTE — Assessment & Plan Note (Signed)
Prediabetes with polyphagia- Last A1c was 6.1/ Insulin 12.1- not at goals.   Medication(s): None She is working on nutrition plan to decrease simple carbohydrates, increase lean proteins and exercise to promote weight loss, improve glycemic control and prevent progression to Type 2 diabetes.   Lab Results  Component Value Date   HGBA1C 6.1 (H) 12/27/2021   Lab Results  Component Value Date   INSULIN 12.1 12/27/2021    Plan: Start Wegovy 0.25 mg SQ weekly Continue working on nutrition plan to decrease simple carbohydrates, increase lean proteins and exercise to promote weight loss, improve glycemic control and prevent progression to Type 2 diabetes.  Recheck labs in 2-3 months after begins Wegovy .

## 2022-06-07 ENCOUNTER — Other Ambulatory Visit (HOSPITAL_BASED_OUTPATIENT_CLINIC_OR_DEPARTMENT_OTHER): Payer: Self-pay

## 2022-06-27 ENCOUNTER — Telehealth: Payer: 59 | Admitting: Primary Care

## 2022-06-27 ENCOUNTER — Encounter: Payer: Self-pay | Admitting: Primary Care

## 2022-06-27 DIAGNOSIS — G4733 Obstructive sleep apnea (adult) (pediatric): Secondary | ICD-10-CM | POA: Diagnosis not present

## 2022-06-27 NOTE — Progress Notes (Signed)
Virtual Visit via Video Note  I connected with Rachael [name] on 06/27/22 at 10:00 AM EDT by a video enabled telemedicine application and verified that I am speaking with the correct person using two identifiers.  Location: Patient: Home Provider: Office   I discussed the limitations of evaluation and management by telemedicine and the availability of in person appointments. The patient expressed understanding and agreed to proceed.  History of Present Illness: 62 year old female, ever smoked.  Past medical history significant for IBS, anemia, hyperlipidemia, vitamin D deficiency, fatigue.   Previous [facility] pulmonary encounter: 01/09/2022 Patient presents today for sleep consult. She has symptoms for chronic fatigue and insomnia. She has issues sleeping at night. She reports loud snoring.  Typical bedtime is between 9 and 10 PM.  It can take her up to 2 to 3 hours to fall asleep. She will take NyQuil after a night or two of not sleeping well which will help her fall asleep. She wakes up on average once a night.  She starts her day at 5 AM.  Weight is up 25 pounds.  No previous sleep study.  She works from home. Denies symptoms of narcolepsy, cataplexy or sleepwalking.  Sleep questionnaire Symptoms- sleeping less than 5 hours a night, struggle to sleep    Prior sleep study- none  Bedtime- 9-10pm  Time to fall asleep- 2-3 hours Nocturnal awakenings- once Out of bed/start of day- 5am Weight changes- up 25 lbs Do you operate heavy machinery- no Do you currently wear CPAP- no Do you current wear oxygen- no Epworth- 5   06/27/2022- Interim hx  Patient contacted today to review his sleep study results.  Patient had a home sleep study with [facility] on 05/12/2022 that showed AHI 4% between 18 and 32.  She had a total number of 18 apneas (79 obstructive apneas; 1 central or mixed apnea). SpO2 low 79% (oximetry baseline 100%).  We reviewed sleep study results today. Patient has moderate  obstructive sleep apnea with moderate oxygen desaturations.  Discussed treatment options, recommending she be started on auto CPAP and patient in agreement with plan.   Observations/Objective:  Appears well without overt respiratory symptoms   Assessment and Plan:  Moderate OSA: - HST on 05/12/22 showed moderate OSA>> AHI 4% between 18 and 32/hour with SpO2 low 79%  - Reviewed sleep study results, risks of untreated sleep apnea and treatment options - Recommend patient be started on auto CPAP due to severity of her OSA and moderate oxygen desaturations.  Patient in agreement with plan. -DME order placed for auto CPAP 5 to 15 cm H2O with heated humidity and mask of choice - Advised patient aim to wear CPAP nightly for 4 to 6 hours or longer, work on weight loss efforts, avoid driving if experiencing excessive daytime sleepiness fatigue  Follow Up Instructions:   31-90 days after started CPAP for compliance  I discussed the assessment and treatment plan with the patient. The patient was provided an opportunity to ask questions and all were answered. The patient agreed with the plan and demonstrated an understanding of the instructions.   The patient was advised to call back or seek an in-person evaluation if the symptoms worsen or if the condition fails to improve as anticipated.  I provided 22 minutes of non-face-to-face time during this encounter.   Glenford Bayley, NP

## 2022-06-27 NOTE — Progress Notes (Signed)
Reviewed and agree with assessment/plan.   Kamoria Lucien, MD [facility] 06/27/2022, 3:18 PM Pager:  [phone]  

## 2022-06-27 NOTE — Patient Instructions (Signed)
Sleep study shows you have moderate obstructive sleep apnea Recommending you be started on auto CPAP Once you get your CPAP aim to wear every night for 4 to 6 hours or longer Please make sure to schedule a follow-up 31 to 90 days with [facility] NP after CPAP start for compliance check  CPAP and BIPAP Information CPAP and BIPAP are methods that use air pressure to keep your airways open and to help you breathe well. CPAP and BIPAP use different amounts of pressure. Your health care provider will tell you whether CPAP or BIPAP would be more helpful for you. CPAP stands for "continuous positive airway pressure." With CPAP, the amount of pressure stays the same while you breathe in (inhale) and out (exhale). BIPAP stands for "bi-level positive airway pressure." With BIPAP, the amount of pressure will be higher when you inhale and lower when you exhale. This allows you to take larger breaths. CPAP or BIPAP may be used in the hospital, or your health care provider may want you to use it at home. You may need to have a sleep study before your health care provider can order a machine for you to use at home. What are the advantages? CPAP or BIPAP can be helpful if you have: Sleep apnea. Chronic obstructive pulmonary disease (COPD). Heart failure. Medical conditions that cause muscle weakness, including muscular dystrophy or amyotrophic lateral sclerosis (ALS). Other problems that cause breathing to be shallow, weak, abnormal, or difficult. CPAP and BIPAP are most commonly used for obstructive sleep apnea (OSA) to keep the airways from collapsing when the muscles relax during sleep. What are the risks? Generally, this is a safe treatment. However, problems may occur, including: Irritated skin or skin sores if the mask does not fit properly. Dry or stuffy nose or nosebleeds. Dry mouth. Feeling gassy or bloated. Sinus or lung infection if the equipment is not cleaned properly. When should CPAP or BIPAP be  used? In most cases, the mask only needs to be worn during sleep. Generally, the mask needs to be worn throughout the night and during any daytime naps. People with certain medical conditions may also need to wear the mask at other times, such as when they are awake. Follow instructions from your health care provider about when to use the machine. What happens during CPAP or BIPAP?  Both CPAP and BIPAP are provided by a small machine with a flexible plastic tube that attaches to a plastic mask that you wear. Air is blown through the mask into your nose or mouth. The amount of pressure that is used to blow the air can be adjusted on the machine. Your health care provider will set the pressure setting and help you find the best mask for you. Tips for using the mask Because the mask needs to be snug, some people feel trapped or closed-in (claustrophobic) when first using the mask. If you feel this way, you may need to get used to the mask. One way to do this is to hold the mask loosely over your nose or mouth and then gradually apply the mask more snugly. You can also gradually increase the amount of time that you use the mask. Masks are available in various types and sizes. If your mask does not fit well, talk with your health care provider about getting a different one. Some common types of masks include: Full face masks, which fit over the mouth and nose. Nasal masks, which fit over the nose. Nasal pillow or prong  masks, which fit into the nostrils. If you are using a mask that fits over your nose and you tend to breathe through your mouth, a chin strap may be applied to help keep your mouth closed. Use a skin barrier to protect your skin as told by your health care provider. Some CPAP and BIPAP machines have alarms that may sound if the mask comes off or develops a leak. If you have trouble with the mask, it is very important that you talk with your health care provider about finding a way to make the  mask easier to tolerate. Do not stop using the mask. There could be a negative impact on your health if you stop using the mask. Tips for using the machine Place your CPAP or BIPAP machine on a secure table or stand near an electrical outlet. Know where the on/off switch is on the machine. Follow instructions from your health care provider about how to set the pressure on your machine and when you should use it. Do not eat or drink while the CPAP or BIPAP machine is on. Food or fluids could get pushed into your lungs by the pressure of the CPAP or BIPAP. For home use, CPAP and BIPAP machines can be rented or purchased through home health care companies. Many different brands of machines are available. Renting a machine before purchasing may help you find out which particular machine works well for you. Your health insurance company may also decide which machine you may get. Keep the CPAP or BIPAP machine and attachments clean. Ask your health care provider for specific instructions. Check the humidifier if you have a dry stuffy nose or nosebleeds. Make sure it is working correctly. Follow these instructions at home: Take over-the-counter and prescription medicines only as told by your health care provider. Ask if you can take sinus medicine if your sinuses are blocked. Do not use any products that contain nicotine or tobacco. These products include cigarettes, chewing tobacco, and vaping devices, such as e-cigarettes. If you need help quitting, ask your health care provider. Keep all follow-up visits. This is important. Contact a health care provider if: You have redness or pressure sores on your head, face, mouth, or nose from the mask or head gear. You have trouble using the CPAP or BIPAP machine. You cannot tolerate wearing the CPAP or BIPAP mask. Someone tells you that you snore even when wearing your CPAP or BIPAP. Get help right away if: You have trouble breathing. You feel  confused. Summary CPAP and BIPAP are methods that use air pressure to keep your airways open and to help you breathe well. If you have trouble with the mask, it is very important that you talk with your health care provider about finding a way to make the mask easier to tolerate. Do not stop using the mask. There could be a negative impact to your health if you stop using the mask. Follow instructions from your health care provider about when to use the machine. This information is not intended to replace advice given to you by your health care provider. Make sure you discuss any questions you have with your health care provider. Document Revised: 09/06/2020 Document Reviewed: 01/07/2020 Elsevier Patient Education  2023 [employer].

## 2022-06-27 NOTE — Addendum Note (Signed)
Addended by: Charlott Holler on: 06/27/2022 10:36 AM   Modules accepted: Orders

## 2022-07-01 ENCOUNTER — Ambulatory Visit (INDEPENDENT_AMBULATORY_CARE_PROVIDER_SITE_OTHER): Payer: 59 | Admitting: Physician Assistant

## 2022-07-01 ENCOUNTER — Encounter (INDEPENDENT_AMBULATORY_CARE_PROVIDER_SITE_OTHER): Payer: Self-pay | Admitting: Physician Assistant

## 2022-07-01 VITALS — BP 117/77 | HR 80 | Temp 98.3°F | Ht 62.0 in | Wt 181.0 lb

## 2022-07-01 DIAGNOSIS — E669 Obesity, unspecified: Secondary | ICD-10-CM | POA: Diagnosis not present

## 2022-07-01 DIAGNOSIS — R7303 Prediabetes: Secondary | ICD-10-CM | POA: Diagnosis not present

## 2022-07-01 DIAGNOSIS — E559 Vitamin D deficiency, unspecified: Secondary | ICD-10-CM

## 2022-07-01 DIAGNOSIS — E7849 Other hyperlipidemia: Secondary | ICD-10-CM | POA: Diagnosis not present

## 2022-07-01 DIAGNOSIS — Z6833 Body mass index (BMI) 33.0-33.9, adult: Secondary | ICD-10-CM

## 2022-07-01 NOTE — Progress Notes (Addendum)
Office: [phone]  /  Fax: [phone]  WEIGHT SUMMARY AND BIOMETRICS  Vitals Temp: 98.3 F (36.8 C) BP: 117/77 Pulse Rate: 80 SpO2: 95 %   Anthropometric Measurements Height: 5\' 2"  (1.575 m) Weight: 181 lb (82.1 kg) BMI (Calculated): 33.1 Weight at Last Visit: 181 lb Weight Lost Since Last Visit: 0 lb Weight Gained Since Last Visit: 0 lb   Body Composition  Body Fat %: 43.2 % Fat Mass (lbs): 78.2 lbs Muscle Mass (lbs): 97.6 lbs Total Body Water (lbs): 66.4 lbs Visceral Fat Rating : 12   Other Clinical Data Fasting: no Labs: no Today's Visit #: 9 Starting Date: 12/27/21     HPI  Chief Complaint: OBESITY  Rachael [name] is here to discuss her progress with her obesity treatment plan. She is on the the Category 1 Plan and states she is following her eating plan approximately 95 % of the time. She states she is exercising 0 minutes 0 times per week.   Interval History:  Since last office visit she has maintained weight loss.  She went on a cruise and had a great time, but became sick with Covid following the cruise.  She has been on [facility] [employer]) for treatment. She is much improved, but still feeling some fatigue.  Hunger/appetite-moderately controlled Cravings- some comfort eating while sick Stress- Some increased family stress - Helping daughter move Sleep- Had sleep study which showed sleep apnea, but has not yet obtained CPAP machine Exercise-plans to resume working out at gym now that she is recovering from Covid  Pharmacotherapy: Just started Wegovy 0.25 mg weekly. Denies mass in neck, dysphagia, dyspepsia, persistent hoarseness, abdominal pain, or N/V/Constipation or diarrhea. Has annual eye exam. Mood is stable.  Has 2 more loading doses and we discussed follow up prior to increasing dose.   TREATMENT PLAN FOR OBESITY:  Recommended Dietary Goals  Mairany is currently in the action stage of change. As such, her goal is to continue weight  management plan. She has agreed to the Category 1 Plan.  Behavioral Intervention  We discussed the following Behavioral Modification Strategies today: increasing lean protein intake, decreasing simple carbohydrates , increasing vegetables, increasing lower glycemic fruits, avoiding skipping meals, increasing water intake, continue to practice mindfulness when eating, and planning for success.  Additional resources provided today: NA  Recommended Physical Activity Goals  Earma has been advised to work up to 150 minutes of moderate intensity aerobic activity a week and strengthening exercises 2-3 times per week for cardiovascular health, weight loss maintenance and preservation of muscle mass.   She has agreed to Continue current level of physical activity  and Think about ways to increase daily physical activity and overcoming barriers to exercise   Pharmacotherapy We discussed various medication options to help Shandale with her weight loss efforts and we both agreed to continue Wegovy 0.25 mg weekly.    Return in about 2 weeks (around 07/15/2022).Marland Kitchen She was informed of the importance of frequent follow up visits to maximize her success with intensive lifestyle modifications for her multiple health conditions.  PHYSICAL EXAM:  Blood pressure 117/77, pulse 80, temperature 98.3 F (36.8 C), height 5\' 2"  (1.575 m), weight 181 lb (82.1 kg), last menstrual period 09/22/2012, SpO2 95 %. Body mass index is 33.11 kg/m.  General: She is overweight, cooperative, alert, well developed, and in no acute distress. PSYCH: Has normal mood, affect and thought process.   Cardiovascular: HR 80's regular Lungs: Normal breathing effort, no conversational dyspnea. Neuro: no focal  deficits  DIAGNOSTIC DATA REVIEWED:  BMET    Component Value Date/Time   NA 142 12/27/2021 0920   K 3.7 12/27/2021 0920   CL 103 12/27/2021 0920   CO2 24 12/27/2021 0920   GLUCOSE 94 12/27/2021 0920   BUN 6 (L) 12/27/2021  0920   CREATININE 0.56 (L) 12/27/2021 0920   CALCIUM 9.5 12/27/2021 0920   Lab Results  Component Value Date   HGBA1C 6.1 (H) 12/27/2021   Lab Results  Component Value Date   INSULIN 12.1 12/27/2021   Lab Results  Component Value Date   TSH 1.450 12/27/2021   CBC    Component Value Date/Time   WBC 4.5 12/27/2021 0920   RBC 4.49 12/27/2021 0920   HGB 12.0 12/27/2021 0920   HCT 39.0 12/27/2021 0920   PLT 213 12/27/2021 0920   MCV 87 12/27/2021 0920   MCH 26.7 12/27/2021 0920   MCHC 30.8 (L) 12/27/2021 0920   RDW 14.0 12/27/2021 0920   Iron Studies No results found for: "IRON", "TIBC", "FERRITIN", "IRONPCTSAT" Lipid Panel     Component Value Date/Time   CHOL 143 12/27/2021 0920   TRIG 79 12/27/2021 0920   HDL 50 12/27/2021 0920   LDLCALC 78 12/27/2021 0920   Hepatic Function Panel     Component Value Date/Time   PROT 7.3 12/27/2021 0920   ALBUMIN 4.9 12/27/2021 0920   AST 21 12/27/2021 0920   ALT 22 12/27/2021 0920   ALKPHOS 79 12/27/2021 0920   BILITOT 0.3 12/27/2021 0920      Component Value Date/Time   TSH 1.450 12/27/2021 0920   Nutritional Lab Results  Component Value Date   VD25OH 29.8 (L) 12/27/2021    ASSOCIATED CONDITIONS ADDRESSED TODAY  ASSESSMENT AND PLAN  Problem List Items Addressed This Visit     Hyperlipidemia - Primary   Vitamin D deficiency   Prediabetes   Generalized obesity- Start BMI 33.11   BMI 33.0-33.9,adult Current BMI 33.2  Hyperlipidemia LDL is at goal. Medication(s): Crestor 10 mg daily. No side effects with Crestor. Wegovy to reduce cardiovascular risk factors. Denies mass in neck, dysphagia, dyspepsia, persistent hoarseness, abdominal pain, or N/V/Constipation or diarrhea. Has annual eye exam. Mood is stable.   Cardiovascular risk factors: dyslipidemia and obesity (BMI >= 30 kg/m2)  Lab Results  Component Value Date   CHOL 143 12/27/2021   HDL 50 12/27/2021   LDLCALC 78 12/27/2021   TRIG 79 12/27/2021   Lab  Results  Component Value Date   ALT 22 12/27/2021   AST 21 12/27/2021   ALKPHOS 79 12/27/2021   BILITOT 0.3 12/27/2021   The 10-year ASCVD risk score (Arnett DK, et al., 2019) is: 3.1%   Values used to calculate the score:     Age: 62 years     Sex: Female     Is Non-Hispanic African American: Yes     Diabetic: No     Tobacco smoker: No     Systolic Blood Pressure: 117 mmHg     Is BP treated: No     HDL Cholesterol: 50 mg/dL     Total Cholesterol: 143 mg/dL  Plan: Continue statin. Started Wegovy to decrease cardiovascular risks. Continue Wegovy and titrate as tolerates.  Continue to work on nutrition plan -decreasing simple carbohydrates, increasing lean proteins, decreasing saturated fats and cholesterol , avoiding trans fats and exercise as able to promote weight loss, improve lipids and decrease cardiovascular risks.    Prediabetes with Polyphagia:  Last A1c was  6.1/ Insulin 12.1  Medication(s): Wegovy 0.25 mg SQ weekly Denies mass in neck, dysphagia, dyspepsia, persistent hoarseness, abdominal pain, or N/V/Constipation or diarrhea. Has annual eye exam. Mood is stable.  Patient denies a personal or family history of pancreatitis, medullary thyroid carcinoma or multiple endocrine neoplasia type II. Recommend reviewing pen training video online.  Polyphagia:No Lab Results  Component Value Date   HGBA1C 6.1 (H) 12/27/2021   Lab Results  Component Value Date   INSULIN 12.1 12/27/2021    Plan: Continue Wegovy 0.25 mg SQ weekly Plan to increase dose next visit if continues to tolerate well.  Continue working on nutrition plan to decrease simple carbohydrates, increase lean proteins and exercise to promote weight loss, improve glycemic control and prevent progression to Type 2 diabetes.  Will plan to recheck A1c, CMET over next month.   Vitamin D Deficiency Vitamin D is not at goal of 50.  Most recent vitamin D level was 29.8. She is on  prescription ergocalciferol 50,000  IU weekly. No side effects with Ergocalciferol.  Lab Results  Component Value Date   VD25OH 29.8 (L) 12/27/2021    Plan: Continue  prescription ergocalciferol 50,000 IU weekly No refill needed this visit.  Low vitamin D levels can be associated with adiposity and may result in leptin resistance and weight gain. Also associated with fatigue. Currently on vitamin D supplementation without any adverse effects.    ATTESTASTION STATEMENTS:  Reviewed by clinician on day of visit: allergies, medications, problem list, medical history, surgical history, family history, social history, and previous encounter notes.   I have personally spent 35 minutes total time today in preparation, patient care, nutritional counseling and documentation for this visit, including the following: review of clinical lab tests; review of medical tests/procedures/services.      Anastasios Melander, PA-C

## 2022-07-09 ENCOUNTER — Other Ambulatory Visit (INDEPENDENT_AMBULATORY_CARE_PROVIDER_SITE_OTHER): Payer: Self-pay | Admitting: Family Medicine

## 2022-07-09 DIAGNOSIS — E669 Obesity, unspecified: Secondary | ICD-10-CM

## 2022-07-17 ENCOUNTER — Ambulatory Visit (INDEPENDENT_AMBULATORY_CARE_PROVIDER_SITE_OTHER): Payer: 59 | Admitting: Physician Assistant

## 2022-07-17 ENCOUNTER — Encounter (INDEPENDENT_AMBULATORY_CARE_PROVIDER_SITE_OTHER): Payer: Self-pay | Admitting: Physician Assistant

## 2022-07-17 VITALS — BP 111/73 | HR 86 | Temp 98.2°F | Ht 62.0 in | Wt 181.0 lb

## 2022-07-17 DIAGNOSIS — E559 Vitamin D deficiency, unspecified: Secondary | ICD-10-CM | POA: Diagnosis not present

## 2022-07-17 DIAGNOSIS — E669 Obesity, unspecified: Secondary | ICD-10-CM | POA: Diagnosis not present

## 2022-07-17 DIAGNOSIS — R7303 Prediabetes: Secondary | ICD-10-CM | POA: Diagnosis not present

## 2022-07-17 DIAGNOSIS — Z6833 Body mass index (BMI) 33.0-33.9, adult: Secondary | ICD-10-CM

## 2022-07-17 MED ORDER — WEGOVY 0.5 MG/0.5ML ~~LOC~~ SOAJ
0.5000 mg | SUBCUTANEOUS | 0 refills | Status: DC
Start: 2022-07-17 — End: 2022-08-14

## 2022-07-17 NOTE — Progress Notes (Unsigned)
.smr  Office: [phone]  /  Fax: [phone]  WEIGHT SUMMARY AND BIOMETRICS  Vitals Temp: 98.2 F (36.8 C) BP: 111/73 Pulse Rate: 86 SpO2: 95 %   Anthropometric Measurements Height: 5\' 2"  (1.575 m) Weight: 181 lb (82.1 kg) BMI (Calculated): 33.1 Weight at Last Visit: 181 lb Weight Lost Since Last Visit: 0 Weight Gained Since Last Visit: 0   Body Composition  Body Fat %: 39.1 % Fat Mass (lbs): 70.8 lbs Muscle Mass (lbs): 104.8 lbs Total Body Water (lbs): 70 lbs Visceral Fat Rating : 11   Other Clinical Data Fasting: no Labs: no Today's Visit #: 9 Starting Date: 12/27/21     HPI  Chief Complaint: OBESITY  Rachael [name] is here to discuss her progress with her obesity treatment plan. She is on the the Category 1 Plan and states she is following her eating plan approximately 100 % of the time. She states she is exercising/trainer/walking 60 minutes 5-6 times per week.   Interval History:  Since last office visit she has maintained weight loss Hunger/appetite-moderate control Cravings- not excessive Stress- manageable Sleep- Mostly restorative. Does take Benadryl prn to help with sleep at times Exercise-Working with personal trainer 2 days per week and then treadmill x 1 hr other days Hydration-at least 64 oz daily Protein- gets at least 80+ grams daily Has graduation parties coming up and discussed some celebration strategies.   Pharmacotherapy:  Wegovy 0.25 mg weekly. Denies mass in neck, dysphagia, dyspepsia, persistent hoarseness, abdominal pain, or N/V/Constipation or diarrhea. Has annual eye exam. Mood is stable.    TREATMENT PLAN FOR OBESITY:  Recommended Dietary Goals  Rachael [name] is currently in the action stage of change. As such, her goal is to continue weight management plan. She has agreed to the Category 1 Plan.  Behavioral Intervention  We discussed the following Behavioral Modification Strategies today: increasing lean protein intake, decreasing  simple carbohydrates , increasing vegetables, increasing lower glycemic fruits, avoiding skipping meals, increasing water intake, continue to practice mindfulness when eating, planning for success, and celebration eating strategies.  Additional resources provided today: NA  Recommended Physical Activity Goals  Rachael [name] has been advised to work up to 150 minutes of moderate intensity aerobic activity a week and strengthening exercises 2-3 times per week for cardiovascular health, weight loss maintenance and preservation of muscle mass.   She has agreed to Continue current level of physical activity    Pharmacotherapy We discussed various medication options to help Rachael [name] with her weight loss efforts and we both agreed to increase Wegovy to 0.5 mg weekly.    Return in about 3 weeks (around 08/07/2022).Marland Kitchen She was informed of the importance of frequent follow up visits to maximize her success with intensive lifestyle modifications for her multiple health conditions.  PHYSICAL EXAM:  Blood pressure 111/73, pulse 86, temperature 98.2 F (36.8 C), height 5\' 2"  (1.575 m), weight 181 lb (82.1 kg), last menstrual period 09/22/2012, SpO2 95 %. Body mass index is 33.11 kg/m.  General: She is overweight, cooperative, alert, well developed, and in no acute distress. PSYCH: Has normal mood, affect and thought process.   Cardiovascular: HR 80's regular Lungs: Normal breathing effort, no conversational dyspnea. Neuro: no focal deficits  DIAGNOSTIC DATA REVIEWED:  BMET    Component Value Date/Time   NA 142 12/27/2021 0920   K 3.7 12/27/2021 0920   CL 103 12/27/2021 0920   CO2 24 12/27/2021 0920   GLUCOSE 94 12/27/2021 0920   BUN 6 (L) 12/27/2021 0920  CREATININE 0.56 (L) 12/27/2021 0920   CALCIUM 9.5 12/27/2021 0920   Lab Results  Component Value Date   HGBA1C 6.1 (H) 12/27/2021   Lab Results  Component Value Date   INSULIN 12.1 12/27/2021   Lab Results  Component Value Date   TSH  1.450 12/27/2021   CBC    Component Value Date/Time   WBC 4.5 12/27/2021 0920   RBC 4.49 12/27/2021 0920   HGB 12.0 12/27/2021 0920   HCT 39.0 12/27/2021 0920   PLT 213 12/27/2021 0920   MCV 87 12/27/2021 0920   MCH 26.7 12/27/2021 0920   MCHC 30.8 (L) 12/27/2021 0920   RDW 14.0 12/27/2021 0920   Iron Studies No results found for: "IRON", "TIBC", "FERRITIN", "IRONPCTSAT" Lipid Panel     Component Value Date/Time   CHOL 143 12/27/2021 0920   TRIG 79 12/27/2021 0920   HDL 50 12/27/2021 0920   LDLCALC 78 12/27/2021 0920   Hepatic Function Panel     Component Value Date/Time   PROT 7.3 12/27/2021 0920   ALBUMIN 4.9 12/27/2021 0920   AST 21 12/27/2021 0920   ALT 22 12/27/2021 0920   ALKPHOS 79 12/27/2021 0920   BILITOT 0.3 12/27/2021 0920      Component Value Date/Time   TSH 1.450 12/27/2021 0920   Nutritional Lab Results  Component Value Date   VD25OH 29.8 (L) 12/27/2021    ASSOCIATED CONDITIONS ADDRESSED TODAY  ASSESSMENT AND PLAN  Problem List Items Addressed This Visit     Vitamin D deficiency   Prediabetes - Primary   Generalized obesity- Start BMI 33.11   Relevant Medications   Semaglutide-Weight Management (WEGOVY) 0.5 MG/0.5ML SOAJ   BMI 33.0-33.9,adult Current BMI 33.2   Prediabetes Last A1c was 6.1/ Insulin 12.1- not at goals.   Medication(s): Wegovy 0.25 mg SQ weekly Polyphagia:No Lab Results  Component Value Date   HGBA1C 6.1 (H) 12/27/2021   Lab Results  Component Value Date   INSULIN 12.1 12/27/2021    Plan: Continue and increase dose Wegovy 0.50 mg SQ weekly Continue working on nutrition plan to decrease simple carbohydrates, increase lean proteins and exercise to promote weight loss, improve glycemic control and prevent progression to Type 2 diabetes.   Vitamin D Deficiency Vitamin D is not at goal of 50.  Most recent vitamin D level was 29.8. She is on  prescription ergocalciferol 50,000 IU weekly. Lab Results  Component Value  Date   VD25OH 29.8 (L) 12/27/2021    Plan: Continue and refill  prescription ergocalciferol 50,000 IU weekly Low vitamin D levels can be associated with adiposity and may result in leptin resistance and weight gain. Also associated with fatigue. Currently on vitamin D supplementation without any adverse effects.    ATTESTASTION STATEMENTS:  Reviewed by clinician on day of visit: allergies, medications, problem list, medical history, surgical history, family history, social history, and previous encounter notes.   I have personally spent 30 minutes total time today in preparation, patient care, nutritional counseling and documentation for this visit, including the following: review of clinical lab tests; review of medical tests/procedures/services.      Rachael Housewright, PA-C

## 2022-08-04 IMAGING — US US PELVIS COMPLETE WITH TRANSVAGINAL
1 series · 13 of 25 positions shown · non-contrast
Comparison: None

CLINICAL DATA: Pelvic pain for 2 weeks, abnormal bleeding, has
Mirena IUD, history fibroids, LMP 04/03/2021



[Series 1: us pelvis complete with transvaginal · 0.26mm/px · 43 acquisitions, 13 frames shown]
[im 1/43]
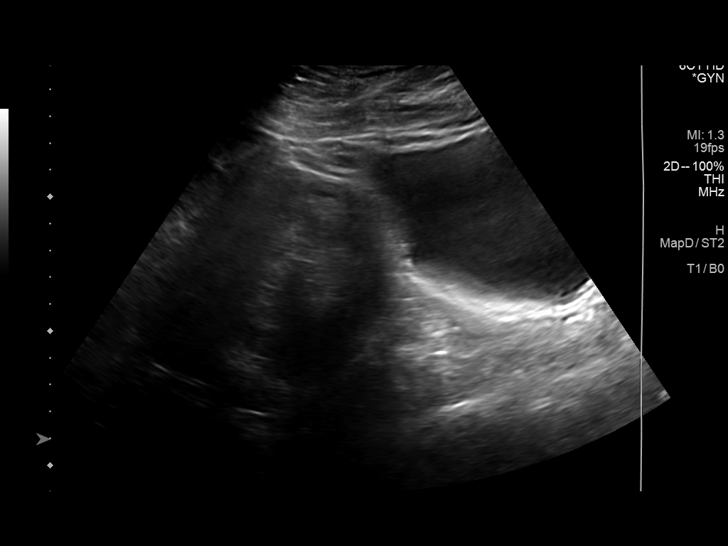
[im 4/43]
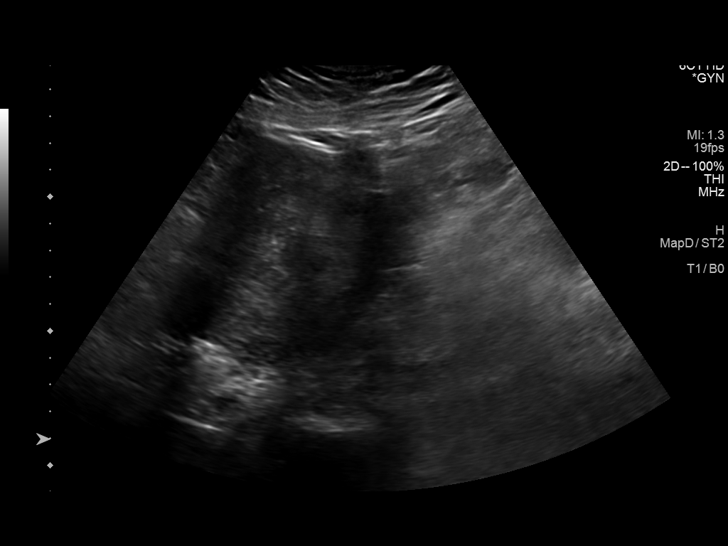
[im 8/43]
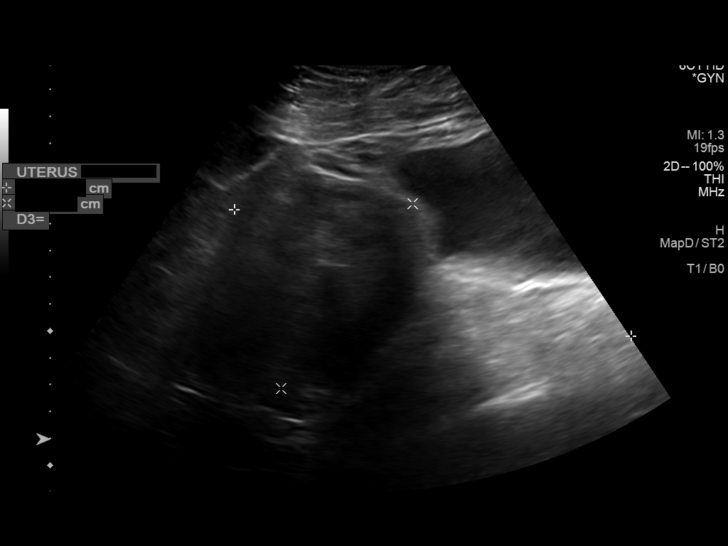
[im 11/43]
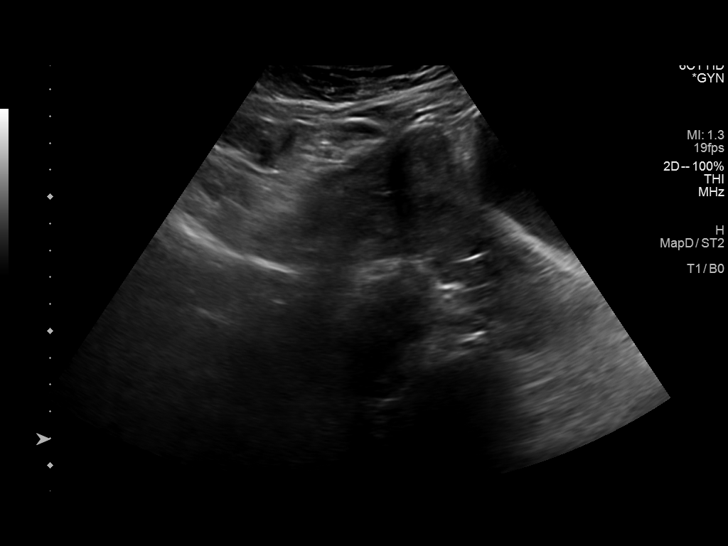
[im 15/43]
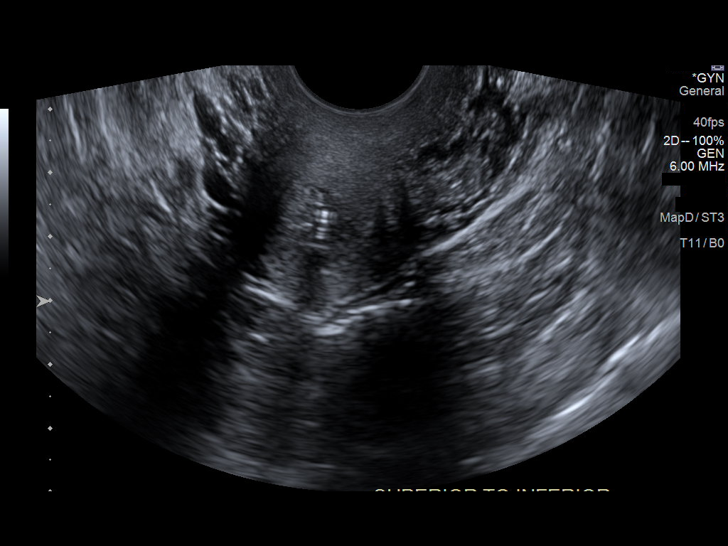
[im 18/43]
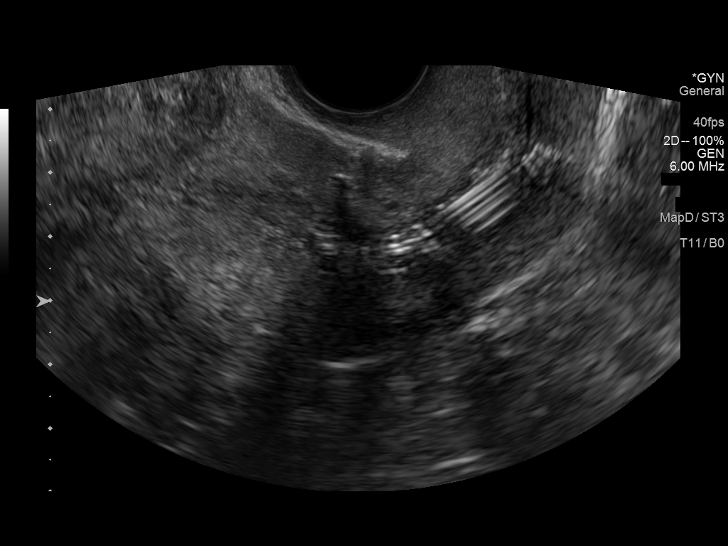
[im 22/43]
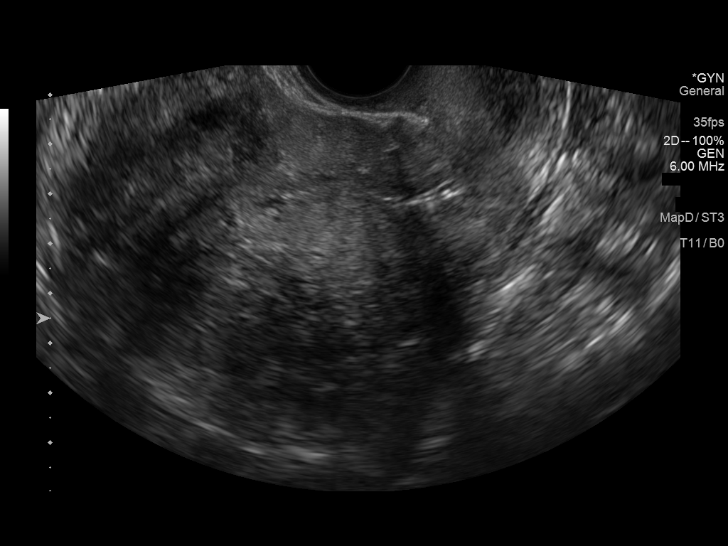
[im 25/43]
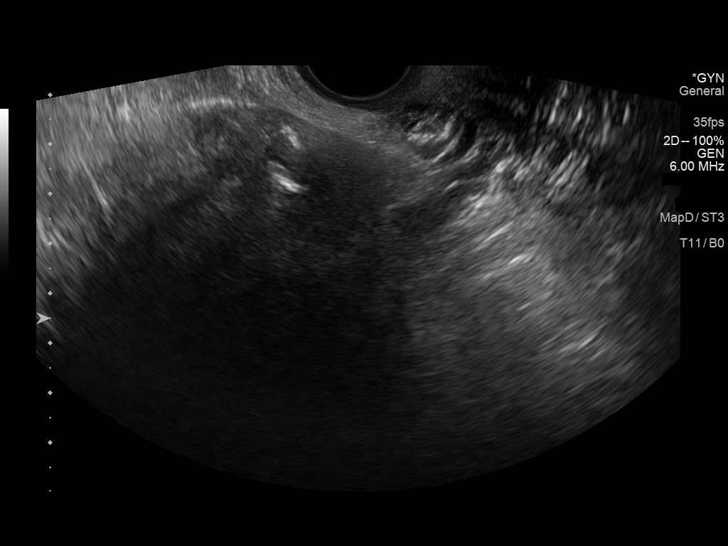
[im 29/43]
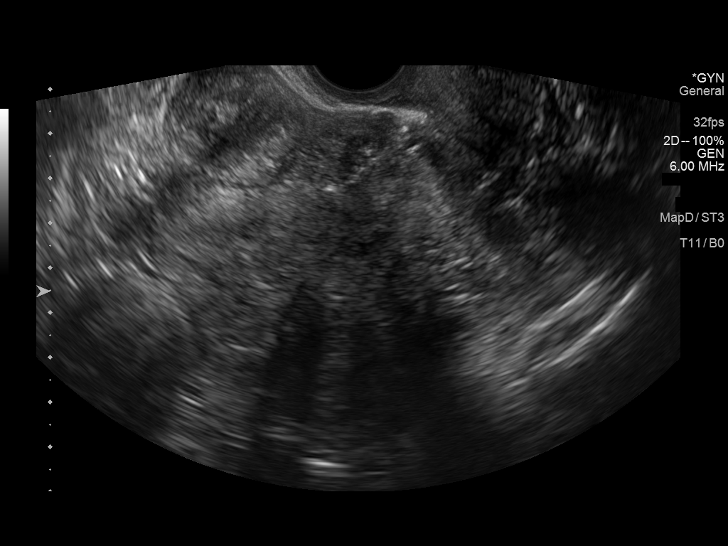
[im 32/43]
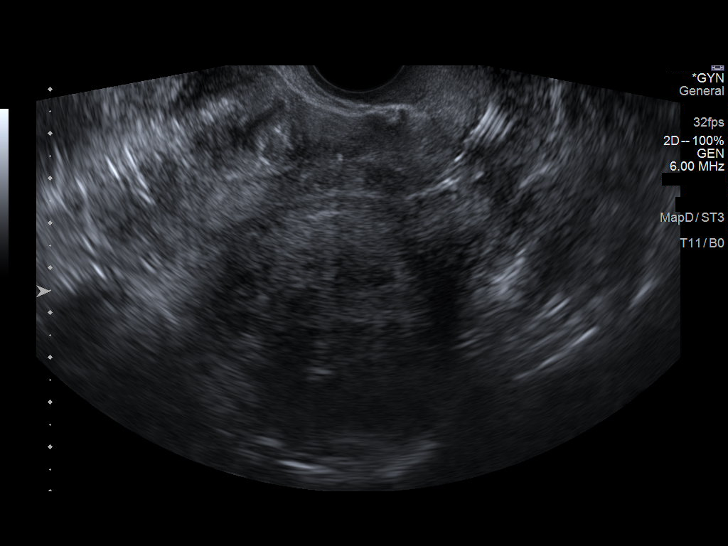
[im 36/43]
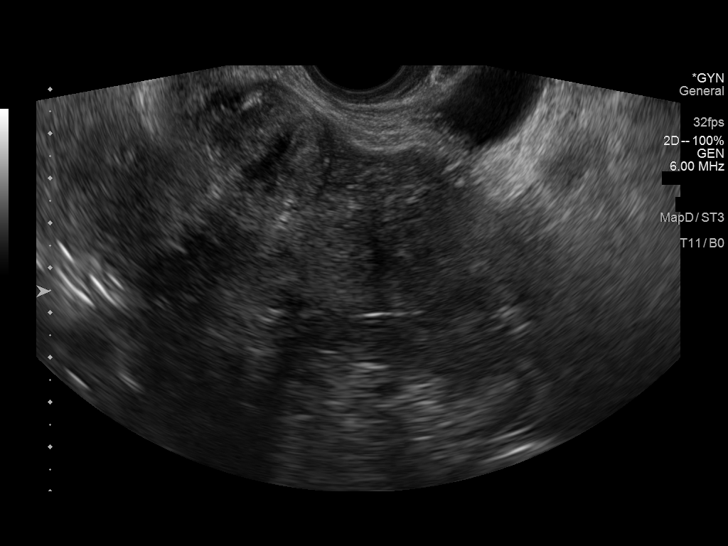
[im 39/43]
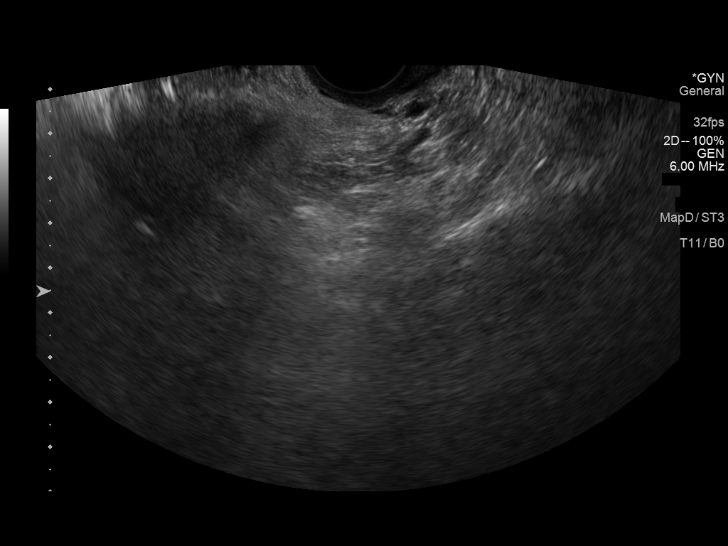
[im 43/43]
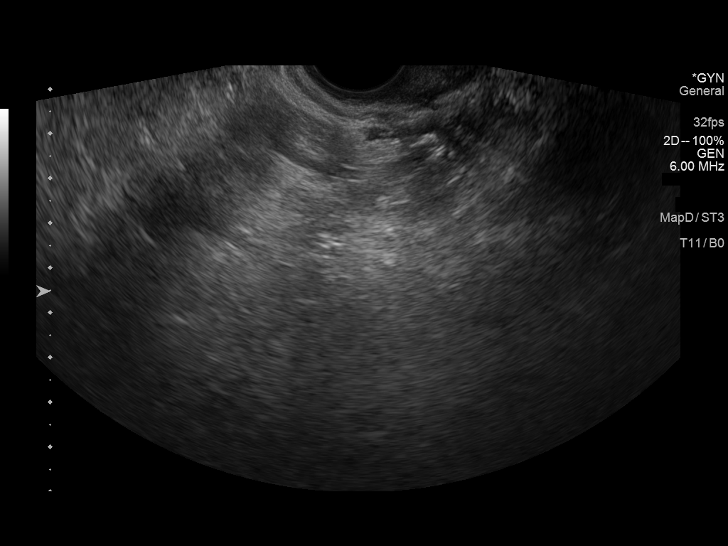

[13 of 25 positions shown; findings below may reference images not displayed]

FINDINGS: Uterus

Measurements: 15.5 x 8.5 x 7.3 cm = volume: 504 mL. Anteverted.
Markedly heterogeneous endometrium. Multiple masses consistent with
leiomyomata. Measured lesions include a calcified leiomyoma in the
anterior uterine wall 1.4 cm diameter. Subserosal anterior leiomyoma
3.0 cm, intramural LEFT anterior leiomyoma 3.1 cm, and a large
posterior transmural upper leiomyoma 6.5 cm.

Endometrium

Thickness: Short segment 11 mm noted. Heterogeneous. Portions
obscured by leiomyomata. No gross fluid. IUD abnormal in position
located at lower uterine segment into cervix.

Right ovary

Not visualized, likely obscured by bowel

Left ovary

Not visualized, likely obscured by bowel

Other findings

No free pelvic fluid.  No adnexal masses.
IMPRESSION: Nonvisualization of ovaries.

IUD abnormal in position located at the lower uterine segment
extending into cervix.

Enlarged uterus containing multiple leiomyomata up to 6.5 cm
diameter.

Suboptimal visualization of endometrial complex due to leiomyomata.

## 2022-08-14 ENCOUNTER — Ambulatory Visit (INDEPENDENT_AMBULATORY_CARE_PROVIDER_SITE_OTHER): Payer: 59 | Admitting: Physician Assistant

## 2022-08-14 ENCOUNTER — Encounter (INDEPENDENT_AMBULATORY_CARE_PROVIDER_SITE_OTHER): Payer: Self-pay | Admitting: Physician Assistant

## 2022-08-14 VITALS — BP 114/73 | HR 97 | Temp 98.1°F | Ht 62.0 in | Wt 180.0 lb

## 2022-08-14 DIAGNOSIS — E669 Obesity, unspecified: Secondary | ICD-10-CM

## 2022-08-14 DIAGNOSIS — R7303 Prediabetes: Secondary | ICD-10-CM

## 2022-08-14 DIAGNOSIS — E7849 Other hyperlipidemia: Secondary | ICD-10-CM | POA: Diagnosis not present

## 2022-08-14 DIAGNOSIS — Z6832 Body mass index (BMI) 32.0-32.9, adult: Secondary | ICD-10-CM

## 2022-08-14 DIAGNOSIS — E559 Vitamin D deficiency, unspecified: Secondary | ICD-10-CM

## 2022-08-14 DIAGNOSIS — Z6833 Body mass index (BMI) 33.0-33.9, adult: Secondary | ICD-10-CM

## 2022-08-14 MED ORDER — WEGOVY 1 MG/0.5ML ~~LOC~~ SOAJ
1.0000 mg | SUBCUTANEOUS | 0 refills | Status: DC
Start: 2022-08-14 — End: 2022-09-17

## 2022-08-14 MED ORDER — VITAMIN D (ERGOCALCIFEROL) 1.25 MG (50000 UNIT) PO CAPS
50000.0000 [IU] | ORAL_CAPSULE | ORAL | 0 refills | Status: DC
Start: 2022-08-14 — End: 2022-11-18

## 2022-08-14 NOTE — Progress Notes (Signed)
.smr  Office: [phone]  /  Fax: [phone]  WEIGHT SUMMARY AND BIOMETRICS  Vitals Temp: 98.1 F (36.7 C) BP: 114/73 Pulse Rate: 97 SpO2: 100 %   Anthropometric Measurements Height: 5\' 2"  (1.575 m) Weight: 180 lb (81.6 kg) BMI (Calculated): 32.91 Weight at Last Visit: 181 lb Weight Lost Since Last Visit: 1 lb Weight Gained Since Last Visit: 0 Starting Weight: 181 lb Total Weight Loss (lbs): 1 lb (0.454 kg) Peak Weight: 181 lb   Body Composition  Body Fat %: 39.7 % Fat Mass (lbs): 71.6 lbs Muscle Mass (lbs): 103.4 lbs Total Body Water (lbs): 70.6 lbs Visceral Fat Rating : 11   Other Clinical Data Fasting: no Labs: no Today's Visit #: 10 Starting Date: 12/27/21     HPI  Chief Complaint: OBESITY  Rachael [name] is here to discuss her progress with her obesity treatment plan. She is on the the Category 1 Plan and states she is following her eating plan approximately 100 % of the time. She states she is exercising personal trainer 45 minutes 2 times per week and treadmill 90 minutes 3 times a week .   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  She is getting back on track. Hunger/appetite-moderate control Cravings-denies excessive cravings Stress-manageable Exercise-regularly as noted with strength training with a personal trainer as well as treadmill Hydration-at least 64 ounces Protein-adequate.  Denies skipping meals  She is helping her daughter to move to [place] over the next few weeks-her daughter will be working with the [employer]. She is also traveling to [place] in August for 3 weeks from August 7 through August 21  Pharmacotherapy: Wegovy 0.5 mg weekly. Denies mass in neck, dysphagia, dyspepsia, persistent hoarseness, abdominal pain, or N/V/Constipation or diarrhea. Has annual eye exam. Mood is stable.    TREATMENT  PLAN FOR OBESITY:  Recommended Dietary Goals  Rachael [name] is currently in the action stage of change. As such, her goal is to continue weight management plan. She has agreed to the Category 1 Plan.  Behavioral Intervention  We discussed the following Behavioral Modification Strategies today: increasing lean protein intake, decreasing simple carbohydrates , increasing vegetables, increasing lower glycemic fruits, avoiding skipping meals, increasing water intake, decreasing eating out or consumption of processed foods, and making healthy choices when eating convenient foods, continue to practice mindfulness when eating, planning for success, and celebration eating strategies.  Additional resources provided today: NA  Recommended Physical Activity Goals  Rachael [name] has been advised to work up to 150 minutes of moderate intensity aerobic activity a week and strengthening exercises 2-3 times per week for cardiovascular health, weight loss maintenance and preservation of muscle mass.   She has agreed to Continue current level of physical activity    Pharmacotherapy We discussed various medication options to help Rachael [name] with her weight loss efforts and we both agreed to increase Wegovy to 1 mg weekly to promote weight loss as well as improve cardiovascular risks with history of hyperlipidemia.   Return in about 4 weeks (around 09/11/2022).Marland Kitchen She was informed of the importance of frequent follow up visits to maximize her success with intensive lifestyle modifications for her multiple health conditions.  PHYSICAL EXAM:  Blood pressure 114/73, pulse 97, temperature 98.1 F (36.7 C), height 5\' 2"  (1.575 m), weight 180 lb (81.6 kg), last menstrual period 09/22/2012, SpO2 100 %. Body mass index is 32.92 kg/m.  General: She is overweight, cooperative, alert, well developed, and in no acute distress. PSYCH: Has normal mood, affect and thought process.   Cardiovascular: HR 90s and regular, blood pressure  114/73 Lungs: Normal breathing effort, no conversational dyspnea. Neuro: No focal deficits  DIAGNOSTIC DATA REVIEWED:  BMET    Component Value Date/Time   NA 142 12/27/2021 0920   K 3.7 12/27/2021 0920   CL 103 12/27/2021 0920   CO2 24 12/27/2021 0920   GLUCOSE 94 12/27/2021 0920   BUN 6 (L) 12/27/2021 0920   CREATININE 0.56 (L) 12/27/2021 0920   CALCIUM 9.5 12/27/2021 0920   Lab Results  Component Value Date   HGBA1C 6.1 (H) 12/27/2021   Lab Results  Component Value Date   INSULIN 12.1 12/27/2021   Lab Results  Component Value Date   TSH 1.450 12/27/2021   CBC    Component Value Date/Time   WBC 4.5 12/27/2021 0920   RBC 4.49 12/27/2021 0920   HGB 12.0 12/27/2021 0920   HCT 39.0 12/27/2021 0920   PLT 213 12/27/2021 0920   MCV 87 12/27/2021 0920   MCH 26.7 12/27/2021 0920   MCHC 30.8 (L) 12/27/2021 0920   RDW 14.0 12/27/2021 0920   Iron Studies No results found for: "IRON", "TIBC", "FERRITIN", "IRONPCTSAT" Lipid Panel     Component Value Date/Time   CHOL 143 12/27/2021 0920   TRIG 79 12/27/2021 0920   HDL 50 12/27/2021 0920   LDLCALC 78 12/27/2021 0920   Hepatic Function Panel     Component Value Date/Time   PROT 7.3 12/27/2021 0920   ALBUMIN 4.9 12/27/2021 0920   AST 21 12/27/2021 0920   ALT 22 12/27/2021 0920   ALKPHOS 79 12/27/2021 0920   BILITOT 0.3 12/27/2021 0920      Component Value Date/Time   TSH 1.450 12/27/2021 0920   Nutritional Lab Results  Component Value Date   VD25OH 29.8 (L) 12/27/2021    ASSOCIATED CONDITIONS ADDRESSED TODAY  ASSESSMENT AND PLAN  Problem List Items Addressed This Visit     Hyperlipidemia - Primary   Relevant Medications   Semaglutide-Weight Management (WEGOVY) 1 MG/0.5ML SOAJ   Vitamin D deficiency   Relevant Medications   Vitamin D, Ergocalciferol, (DRISDOL) 1.25 MG (50000 UNIT) CAPS capsule   Prediabetes   Generalized obesity- Start BMI 33.11   Relevant Medications   Semaglutide-Weight  Management (WEGOVY) 1 MG/0.5ML SOAJ   BMI 33.0-33.9,adult Current BMI 33.2   Hyperlipidemia LDL is at goal. Medication(s): Rosuvastatin 10 mg daily Cardiovascular risk factors: dyslipidemia and obesity (BMI >= 30 kg/m2)  Lab Results  Component Value Date   CHOL 143 12/27/2021   HDL 50 12/27/2021   LDLCALC 78 12/27/2021   TRIG 79 12/27/2021   Lab Results  Component Value Date   ALT 22 12/27/2021   AST 21 12/27/2021   ALKPHOS 79 12/27/2021   BILITOT 0.3 12/27/2021   The 10-year ASCVD risk score (Arnett DK, et al., 2019) is: 2.9%   Values used to calculate the score:     Age: 89 years     Sex: Female     Is Non-Hispanic African American: Yes     Diabetic: No     Tobacco smoker: No     Systolic Blood Pressure: 114 mmHg     Is BP treated: No     HDL Cholesterol: 50 mg/dL     Total Cholesterol: 143 mg/dL  Plan: Continue and increase Wegovy to 1 mg weekly for indication of hyperlipidemia.  Continue rosuvastatin 10 mg daily Continue to work on nutrition plan -decreasing simple carbohydrates, increasing lean proteins, decreasing saturated fats and cholesterol , avoiding trans fats and exercise as able to promote weight loss, improve lipids and decrease cardiovascular risks.   Prediabetes Last A1c was 6.1/insulin 12.1-not at goals Medication(s): Wegovy 0.50 mg SQ weekly Denies mass in neck, dysphagia, dyspepsia, persistent hoarseness, abdominal pain, or N/V/Constipation or diarrhea. Has annual eye exam. Mood is stable.   Polyphagia:No Lab Results  Component Value Date   HGBA1C 6.1 (H) 12/27/2021   Lab Results  Component Value Date   INSULIN 12.1 12/27/2021    Plan: Continue and increase dose Wegovy 1.0 mg SQ weekly Plan to recheck A1c and CMET next visit Continue working on nutrition plan to decrease simple carbohydrates, increase lean proteins and exercise to promote weight loss, improve glycemic control and prevent progression to Type 2 diabetes.    Vitamin D  Deficiency Vitamin D is not at goal of 50.  Most recent vitamin D level was 29.8-not at goal. She is on  prescription ergocalciferol 50,000 IU weekly.  No side effect, no nausea, vomiting or muscle weakness with ergocalciferol Lab Results  Component Value Date   VD25OH 29.8 (L) 12/27/2021    Plan: Continue and refill  prescription ergocalciferol 50,000 IU weekly Low vitamin D levels can be associated with adiposity and may result in leptin resistance and weight gain. Also associated with fatigue. Currently on vitamin D supplementation without any adverse effects.  Plan to recheck vitamin D level next visit.    ATTESTASTION STATEMENTS:  Reviewed by clinician on day of visit: allergies, medications, problem list, medical history, surgical history, family history, social history, and previous encounter notes.   I have personally spent 30 minutes total time today in preparation, patient care, nutritional counseling and documentation for this visit, including the following: review of clinical lab tests; review of medical tests/procedures/services.      Jenin Birdsall, PA-C

## 2022-08-25 ENCOUNTER — Other Ambulatory Visit (INDEPENDENT_AMBULATORY_CARE_PROVIDER_SITE_OTHER): Payer: Self-pay | Admitting: Physician Assistant

## 2022-08-25 DIAGNOSIS — E669 Obesity, unspecified: Secondary | ICD-10-CM

## 2022-09-11 ENCOUNTER — Ambulatory Visit (INDEPENDENT_AMBULATORY_CARE_PROVIDER_SITE_OTHER): Payer: 59 | Admitting: Physician Assistant

## 2022-09-17 ENCOUNTER — Ambulatory Visit (INDEPENDENT_AMBULATORY_CARE_PROVIDER_SITE_OTHER): Payer: 59 | Admitting: Physician Assistant

## 2022-09-17 ENCOUNTER — Other Ambulatory Visit: Payer: Self-pay

## 2022-09-17 ENCOUNTER — Encounter (INDEPENDENT_AMBULATORY_CARE_PROVIDER_SITE_OTHER): Payer: Self-pay | Admitting: Physician Assistant

## 2022-09-17 ENCOUNTER — Other Ambulatory Visit (HOSPITAL_BASED_OUTPATIENT_CLINIC_OR_DEPARTMENT_OTHER): Payer: Self-pay

## 2022-09-17 VITALS — BP 116/74 | HR 104 | Temp 98.5°F | Ht 62.0 in | Wt 180.0 lb

## 2022-09-17 DIAGNOSIS — R7303 Prediabetes: Secondary | ICD-10-CM

## 2022-09-17 DIAGNOSIS — E669 Obesity, unspecified: Secondary | ICD-10-CM

## 2022-09-17 DIAGNOSIS — E559 Vitamin D deficiency, unspecified: Secondary | ICD-10-CM | POA: Diagnosis not present

## 2022-09-17 DIAGNOSIS — Z6833 Body mass index (BMI) 33.0-33.9, adult: Secondary | ICD-10-CM

## 2022-09-17 DIAGNOSIS — E785 Hyperlipidemia, unspecified: Secondary | ICD-10-CM | POA: Diagnosis not present

## 2022-09-17 DIAGNOSIS — E7849 Other hyperlipidemia: Secondary | ICD-10-CM

## 2022-09-17 MED ORDER — WEGOVY 0.5 MG/0.5ML ~~LOC~~ SOAJ
0.5000 mg | SUBCUTANEOUS | 0 refills | Status: DC
Start: 2022-09-17 — End: 2022-10-17
  Filled 2022-09-17 (×2): qty 2, 28d supply, fill #0

## 2022-09-17 NOTE — Progress Notes (Signed)
.smr  Office: [phone]  /  Fax: [phone]  WEIGHT SUMMARY AND BIOMETRICS  Vitals Temp: 98.5 F (36.9 C) BP: 116/74 Pulse Rate: (!) 104 SpO2: 98 %   Anthropometric Measurements Height: 5\' 2"  (1.575 m) Weight: 180 lb (81.6 kg) BMI (Calculated): 32.91 Weight at Last Visit: 180 lb Weight Lost Since Last Visit: 0 Weight Gained Since Last Visit: 0 Starting Weight: 181 lb Total Weight Loss (lbs): 1 lb (0.454 kg) Peak Weight: 181 lb   Body Composition  Body Fat %: 40.4 % Fat Mass (lbs): 73 lbs Muscle Mass (lbs): 102.4 lbs Total Body Water (lbs): 72 lbs Visceral Fat Rating : 11   Other Clinical Data Fasting: no Labs: no Today's Visit #: 11 Starting Date: 12/27/21     HPI  Chief Complaint: OBESITY  Rachael [name] is here to discuss her progress with her obesity treatment plan. She is on the the Category 1 Plan and states she is following her eating plan approximately 80 % of the time. She states she is exercising personal trainer/treadmill 45/90 minutes 5 times per week.   Interval History:  Since last office visit she has maintained.  She has been out of Wegovy due to drug shortage for the past month.   Hunger/appetite-Moderate control. Trying to be mindful while unable to obtain Wegovy Cravings- denies excessive cravings Stress- increased with daughter's move, and traveling to [place] tomorrow and will be gone for 1 month.  Discussed some travel strategies. Reports will need to be mindful to minimize carbohydrate intake, but may have limited choices and will do her best to get adequate protein and water intake.  Sleep- mostly restorative Exercise-Regular with personal trainer and treadmill walking regularly Hydration-adequate   Pharmacotherapy: Wegovy but off for the past month. Never obtained 1 mg dose . Will resume Wegovy 0.5 mg weekly. Denies mass in neck, dysphagia, dyspepsia, persistent hoarseness, abdominal pain, or N/V/Constipation or diarrhea. Has annual  eye exam. Mood is stable.    TREATMENT PLAN FOR OBESITY:  Recommended Dietary Goals  Rachael [name] is currently in the action stage of change. As such, her goal is to continue weight management plan. She has agreed to the Category 1 Plan.  Behavioral Intervention  We discussed the following Behavioral Modification Strategies today: increasing lean protein intake, decreasing simple carbohydrates , increasing vegetables, increasing lower glycemic fruits, increasing fiber rich foods, avoiding skipping meals, increasing water intake, emotional eating strategies and understanding the difference between hunger signals and cravings, continue to practice mindfulness when eating, planning for success, and staying on track while traveling and vacationing.  Additional resources provided today: NA  Recommended Physical Activity Goals  Rachael [name] has been advised to work up to 150 minutes of moderate intensity aerobic activity a week and strengthening exercises 2-3 times per week for cardiovascular health, weight loss maintenance and preservation of muscle mass.   She has agreed to Continue current level of physical activity    Pharmacotherapy We discussed various medication options to help Rachael [name] with her weight loss efforts and we both agreed to decrease Wegovy to 0.5 mg weekly as was unable to obtain 1 mg dose.    Return in about 4 weeks (around 10/15/2022).Marland Kitchen She was informed of the importance of frequent follow up visits to maximize her success with intensive lifestyle modifications for her multiple health conditions.  PHYSICAL EXAM:  Blood pressure 116/74, pulse (!) 104, temperature 98.5 F (36.9 C), height 5\' 2"  (1.575 m), weight 180 lb (81.6 kg), last menstrual period 09/22/2012, SpO2 98%. Body  mass index is 32.92 kg/m.  General: She is overweight, cooperative, alert, well developed, and in no acute distress. PSYCH: Has normal mood, affect and thought process.   Cardiovascular: HR 104 regular-  monitor on GLP 1 medication. BP 116/74 Lungs: Normal breathing effort, no conversational dyspnea. Neuro: no focal deficits  DIAGNOSTIC DATA REVIEWED:  BMET    Component Value Date/Time   NA 142 12/27/2021 0920   K 3.7 12/27/2021 0920   CL 103 12/27/2021 0920   CO2 24 12/27/2021 0920   GLUCOSE 94 12/27/2021 0920   BUN 6 (L) 12/27/2021 0920   CREATININE 0.56 (L) 12/27/2021 0920   CALCIUM 9.5 12/27/2021 0920   Lab Results  Component Value Date   HGBA1C 6.1 (H) 12/27/2021   Lab Results  Component Value Date   INSULIN 12.1 12/27/2021   Lab Results  Component Value Date   TSH 1.450 12/27/2021   CBC    Component Value Date/Time   WBC 4.5 12/27/2021 0920   RBC 4.49 12/27/2021 0920   HGB 12.0 12/27/2021 0920   HCT 39.0 12/27/2021 0920   PLT 213 12/27/2021 0920   MCV 87 12/27/2021 0920   MCH 26.7 12/27/2021 0920   MCHC 30.8 (L) 12/27/2021 0920   RDW 14.0 12/27/2021 0920   Iron Studies No results found for: "IRON", "TIBC", "FERRITIN", "IRONPCTSAT" Lipid Panel     Component Value Date/Time   CHOL 143 12/27/2021 0920   TRIG 79 12/27/2021 0920   HDL 50 12/27/2021 0920   LDLCALC 78 12/27/2021 0920   Hepatic Function Panel     Component Value Date/Time   PROT 7.3 12/27/2021 0920   ALBUMIN 4.9 12/27/2021 0920   AST 21 12/27/2021 0920   ALT 22 12/27/2021 0920   ALKPHOS 79 12/27/2021 0920   BILITOT 0.3 12/27/2021 0920      Component Value Date/Time   TSH 1.450 12/27/2021 0920   Nutritional Lab Results  Component Value Date   VD25OH 29.8 (L) 12/27/2021    ASSOCIATED CONDITIONS ADDRESSED TODAY  ASSESSMENT AND PLAN  Problem List Items Addressed This Visit     Hyperlipidemia - Primary   Relevant Medications   Semaglutide-Weight Management (WEGOVY) 0.5 MG/0.5ML SOAJ   Vitamin D deficiency   Prediabetes   Generalized obesity- Start BMI 33.11   Relevant Medications   Semaglutide-Weight Management (WEGOVY) 0.5 MG/0.5ML SOAJ   BMI 33.0-33.9,adult Current BMI  33.2  Hyperlipidemia LDL is at goal. Medication(s): Wegovy 0.5 mg weekly Cardiovascular risk factors: dyslipidemia, family history of premature cardiovascular disease, and obesity (BMI >= 30 kg/m2)  Lab Results  Component Value Date   CHOL 143 12/27/2021   HDL 50 12/27/2021   LDLCALC 78 12/27/2021   TRIG 79 12/27/2021   Lab Results  Component Value Date   ALT 22 12/27/2021   AST 21 12/27/2021   ALKPHOS 79 12/27/2021   BILITOT 0.3 12/27/2021   The 10-year ASCVD risk score (Arnett DK, et al., 2019) is: 3%   Values used to calculate the score:     Age: 62 years     Sex: Female     Is Non-Hispanic African American: Yes     Diabetic: No     Tobacco smoker: No     Systolic Blood Pressure: 116 mmHg     Is BP treated: No     HDL Cholesterol: 50 mg/dL     Total Cholesterol: 143 mg/dL  Plan: Continue Wegovy to reduce cardiovascular risks.  Continue to work on Engineer, technical sales -decreasing  simple carbohydrates, increasing lean proteins, decreasing saturated fats and cholesterol , avoiding trans fats and exercise as able to promote weight loss, improve lipids and decrease cardiovascular risks.    Prediabetes Last A1c was 6.1/ Insulin 12.1- not at goals Medication(s): Wegovy 0.50 mg SQ weekly Denies mass in neck, dysphagia, dyspepsia, persistent hoarseness, abdominal pain, or N/V/Constipation or diarrhea. Has annual eye exam. Mood is stable.  She is working on Engineer, technical sales to decrease simple carbohydrates, increase lean proteins and exercise to promote weight loss, improve glycemic control and prevent progression to Type 2 diabetes.   Polyphagia:No Lab Results  Component Value Date   HGBA1C 6.1 (H) 12/27/2021   Lab Results  Component Value Date   INSULIN 12.1 12/27/2021    Plan: Continue and refill /resume-Wegovy 0.50 mg SQ weekly Continue working on nutrition plan to decrease simple carbohydrates, increase lean proteins and exercise to promote weight loss, improve glycemic  control and prevent progression to Type 2 diabetes.  Plan to recheck A1c when returns from [place].   Vitamin D Deficiency Vitamin D is not at goal of 50.  Most recent vitamin D level was 29.8. She is on  prescription ergocalciferol 50,000 IU weekly. Does not always remember to take weekly. No refills needed at this time. No side effects with Ergocalciferol. No N/V or muscle weakness.  Lab Results  Component Value Date   VD25OH 29.8 (L) 12/27/2021    Plan: Continue  prescription ergocalciferol 50,000 IU weekly Low vitamin D levels can be associated with adiposity and may result in leptin resistance and weight gain. Also associated with fatigue. Currently on vitamin D supplementation without any adverse effects.  Recheck vitamin D levels when she returns from [place].   ATTESTASTION STATEMENTS:  Reviewed by clinician on day of visit: allergies, medications, problem list, medical history, surgical history, family history, social history, and previous encounter notes.   I have personally spent 30 minutes total time today in preparation, patient care, nutritional counseling and documentation for this visit, including the following: review of clinical lab tests; review of medical tests/procedures/services.      Nicolaos Mitrano, PA-C

## 2022-10-17 ENCOUNTER — Other Ambulatory Visit (HOSPITAL_BASED_OUTPATIENT_CLINIC_OR_DEPARTMENT_OTHER): Payer: Self-pay

## 2022-10-17 ENCOUNTER — Ambulatory Visit (INDEPENDENT_AMBULATORY_CARE_PROVIDER_SITE_OTHER): Payer: 59 | Admitting: Physician Assistant

## 2022-10-17 ENCOUNTER — Encounter (INDEPENDENT_AMBULATORY_CARE_PROVIDER_SITE_OTHER): Payer: Self-pay | Admitting: Physician Assistant

## 2022-10-17 VITALS — BP 121/73 | HR 103 | Temp 98.3°F | Ht 62.0 in | Wt 181.0 lb

## 2022-10-17 DIAGNOSIS — R7303 Prediabetes: Secondary | ICD-10-CM | POA: Diagnosis not present

## 2022-10-17 DIAGNOSIS — E669 Obesity, unspecified: Secondary | ICD-10-CM

## 2022-10-17 DIAGNOSIS — E559 Vitamin D deficiency, unspecified: Secondary | ICD-10-CM

## 2022-10-17 DIAGNOSIS — E7849 Other hyperlipidemia: Secondary | ICD-10-CM

## 2022-10-17 DIAGNOSIS — E785 Hyperlipidemia, unspecified: Secondary | ICD-10-CM

## 2022-10-17 DIAGNOSIS — Z6833 Body mass index (BMI) 33.0-33.9, adult: Secondary | ICD-10-CM

## 2022-10-17 MED ORDER — WEGOVY 0.5 MG/0.5ML ~~LOC~~ SOAJ
0.5000 mg | SUBCUTANEOUS | 0 refills | Status: DC
Start: 2022-10-17 — End: 2022-11-18
  Filled 2022-10-17: qty 2, 28d supply, fill #0

## 2022-10-17 NOTE — Progress Notes (Signed)
.smr  Office: [phone]  /  Fax: [phone]  WEIGHT SUMMARY AND BIOMETRICS  Vitals Temp: 98.3 F (36.8 C) BP: 121/73 Pulse Rate: (!) 103 SpO2: 99 %   Anthropometric Measurements Height: 5\' 2"  (1.575 m) Weight: 181 lb (82.1 kg) BMI (Calculated): 33.1 Weight at Last Visit: 180 lb Weight Lost Since Last Visit: 0 Weight Gained Since Last Visit: 1 lb Starting Weight: 181 lb Total Weight Loss (lbs): 0 lb (0 kg) Peak Weight: 181 lb   Body Composition  Body Fat %: 38.1 % Fat Mass (lbs): 69.2 lbs Muscle Mass (lbs): 106.6 lbs Total Body Water (lbs): 72 lbs Visceral Fat Rating : 11   Other Clinical Data Fasting: no Labs: no Today's Visit #: 13 Starting Date: 12/27/21     HPI  Chief Complaint: OBESITY  Rachael [name] is here to discuss her progress with her obesity treatment plan. Rachael [name] is on the the Category 1 Plan and states Rachael [name] is following her eating plan approximately 0 % of the time. Rachael [name] states Rachael [name] is exercising dancing/walking 1-2 hours minutes 7 times per week. Discussed the use of AI scribe software for clinical note transcription with the patient, who gave verbal consent to proceed.  History of Present Illness  / Interval History:  Since last office visit Rachael [name] is up 1 lb. Bio impedence scale reviewed with the patient:  Up 4.2 lbs muscle mass Down 3.8 lbs adipose mass Rachael [name] recently returned from a trip to [place]. Despite indulging in food during her trip, Rachael [name] reports a surprising lack of weight gain, attributing this to the extensive walking Rachael [name] did and the organic nature of the food Rachael [name] consumed. Rachael [name] also reports an increase in muscle mass and a decrease in adipose tissue, suggesting some weight loss.  The patient is currently on Wegovy for weight loss, vitamin D supplementation, and rosuvastatin for cholesterol management. Rachael [name] reports no adverse effects from these medications.  The patient also mentions a backlog of work upon her return, indicating a high-stress  environment. Despite this, Rachael [name] expresses a desire to return to her meal plan and continue her weight loss journey.    Pharmacotherapy: Wegovy 0.5 mg weekly. Denies mass in neck, dysphagia, dyspepsia, persistent hoarseness, abdominal pain, or N/V/Constipation or diarrhea. Has annual eye exam. Mood is stable.    TREATMENT PLAN FOR OBESITY: Obesity Despite recent travel and increased food intake, patient maintained weight and increased muscle mass, likely due to increased physical activity. Currently on Wegovy 0.5mg  for weight loss. -Continue Wegovy 0.5mg . -Encourage continuation of physical activity and healthy eating habits. -Plan to reassess weight loss progress and consider dose adjustment at next visit. Recommended Dietary Goals  Rachael [name] is currently in the action stage of change. As such, her goal is to continue weight management plan. Rachael [name] has agreed to the Category 1 Plan.  Behavioral Intervention  We discussed the following Behavioral Modification Strategies today: increasing lean protein intake, decreasing simple carbohydrates , increasing vegetables, increasing lower glycemic fruits, avoiding skipping meals, increasing water intake, emotional eating strategies and understanding the difference between hunger signals and cravings, work on managing stress, creating time for self-care and relaxation measures, continue to practice mindfulness when eating, and planning for success.  Additional resources provided today: NA  Recommended Physical Activity Goals  Rachael [name] has been advised to work up to 150 minutes of moderate intensity aerobic activity a week and strengthening exercises 2-3 times per week for cardiovascular health, weight loss maintenance and preservation of muscle mass.   Rachael [name] has agreed to  Continue current level of physical activity    Pharmacotherapy We discussed various medication options to help Rachael [name] with her weight loss efforts and we both agreed to continue Wegovy 0.5  mg weekly.    Return in about 4 weeks (around 11/14/2022).Marland Kitchen Rachael [name] was informed of the importance of frequent follow up visits to maximize her success with intensive lifestyle modifications for her multiple health conditions.  PHYSICAL EXAM:  Blood pressure 121/73, pulse (!) 103, temperature 98.3 F (36.8 C), height 5\' 2"  (1.575 m), weight 181 lb (82.1 kg), last menstrual period 09/22/2012, SpO2 99%. Body mass index is 33.11 kg/m.  General: Rachael [name] is overweight, cooperative, alert, well developed, and in no acute distress. PSYCH: Has normal mood, affect and thought process.   Cardiovascular: HR up slightly 103 today, BP 121/73 Lungs: Normal breathing effort, no conversational dyspnea. Neuro: no focal deficits  DIAGNOSTIC DATA REVIEWED:  BMET    Component Value Date/Time   NA 142 12/27/2021 0920   K 3.7 12/27/2021 0920   CL 103 12/27/2021 0920   CO2 24 12/27/2021 0920   GLUCOSE 94 12/27/2021 0920   BUN 6 (L) 12/27/2021 0920   CREATININE 0.56 (L) 12/27/2021 0920   CALCIUM 9.5 12/27/2021 0920   Lab Results  Component Value Date   HGBA1C 6.1 (H) 12/27/2021   Lab Results  Component Value Date   INSULIN 12.1 12/27/2021   Lab Results  Component Value Date   TSH 1.450 12/27/2021   CBC    Component Value Date/Time   WBC 4.5 12/27/2021 0920   RBC 4.49 12/27/2021 0920   HGB 12.0 12/27/2021 0920   HCT 39.0 12/27/2021 0920   PLT 213 12/27/2021 0920   MCV 87 12/27/2021 0920   MCH 26.7 12/27/2021 0920   MCHC 30.8 (L) 12/27/2021 0920   RDW 14.0 12/27/2021 0920   Iron Studies No results found for: "IRON", "TIBC", "FERRITIN", "IRONPCTSAT" Lipid Panel     Component Value Date/Time   CHOL 143 12/27/2021 0920   TRIG 79 12/27/2021 0920   HDL 50 12/27/2021 0920   LDLCALC 78 12/27/2021 0920   Hepatic Function Panel     Component Value Date/Time   PROT 7.3 12/27/2021 0920   ALBUMIN 4.9 12/27/2021 0920   AST 21 12/27/2021 0920   ALT 22 12/27/2021 0920   ALKPHOS 79 12/27/2021  0920   BILITOT 0.3 12/27/2021 0920      Component Value Date/Time   TSH 1.450 12/27/2021 0920   Nutritional Lab Results  Component Value Date   VD25OH 29.8 (L) 12/27/2021    ASSOCIATED CONDITIONS ADDRESSED TODAY  ASSESSMENT AND PLAN  Problem List Items Addressed This Visit     Hyperlipidemia - Primary   Relevant Medications   Semaglutide-Weight Management (WEGOVY) 0.5 MG/0.5ML SOAJ   Vitamin D deficiency   Generalized obesity- Start BMI 33.11   Relevant Medications   Semaglutide-Weight Management (WEGOVY) 0.5 MG/0.5ML SOAJ   BMI 33.0-33.9,adult Current BMI 33.2    Hyperlipidemia Patient is on rosuvastatin for cholesterol management. No recent lipid panel discussed. -Continue rivastatin. -Rachael [name] has follow up for AWV soon.   Vitamin D deficiency Patient is on vitamin D supplementation. No recent vitamin D level discussed. -Continue vitamin D supplementation. No N/V or muscle weakness with Ergocalciferol.  -Plan to check vitamin D level at next visit.   Prediabetes Last A1c was 6.1  Medication(s): Wegovy 0.50 mg SQ weekly Polyphagia:No Lab Results  Component Value Date   HGBA1C 6.1 (H) 12/27/2021   Lab Results  Component Value Date   INSULIN 12.1 12/27/2021    Plan: Continue and refill Wegovy 0.50 mg SQ weekly Continue working on nutrition plan to decrease simple carbohydrates, increase lean proteins and exercise to promote weight loss, improve glycemic control and prevent progression to Type 2 diabetes.  Recheck A1c next visit.    Follow-up plans -Schedule appointment for 4 weeks. -Order labs including A1c and vitamin D level. -Encourage patient to continue healthy lifestyle habits.  ATTESTASTION STATEMENTS:  Reviewed by clinician on day of visit: allergies, medications, problem list, medical history, surgical history, family history, social history, and previous encounter notes.   I have personally spent 42 minutes total time today in preparation,  patient care, nutritional counseling and documentation for this visit, including the following: review of clinical lab tests; review of medical tests/procedures/services.      Ransom Nickson, PA-C

## 2022-11-18 ENCOUNTER — Other Ambulatory Visit: Payer: Self-pay

## 2022-11-18 ENCOUNTER — Other Ambulatory Visit (HOSPITAL_BASED_OUTPATIENT_CLINIC_OR_DEPARTMENT_OTHER): Payer: Self-pay

## 2022-11-18 ENCOUNTER — Encounter (INDEPENDENT_AMBULATORY_CARE_PROVIDER_SITE_OTHER): Payer: Self-pay | Admitting: Physician Assistant

## 2022-11-18 ENCOUNTER — Ambulatory Visit (INDEPENDENT_AMBULATORY_CARE_PROVIDER_SITE_OTHER): Payer: 59 | Admitting: Physician Assistant

## 2022-11-18 VITALS — BP 122/73 | HR 120 | Temp 98.1°F | Ht 62.0 in | Wt 180.0 lb

## 2022-11-18 DIAGNOSIS — Z6832 Body mass index (BMI) 32.0-32.9, adult: Secondary | ICD-10-CM

## 2022-11-18 DIAGNOSIS — R7303 Prediabetes: Secondary | ICD-10-CM | POA: Diagnosis not present

## 2022-11-18 DIAGNOSIS — E7849 Other hyperlipidemia: Secondary | ICD-10-CM

## 2022-11-18 DIAGNOSIS — E785 Hyperlipidemia, unspecified: Secondary | ICD-10-CM | POA: Diagnosis not present

## 2022-11-18 DIAGNOSIS — E538 Deficiency of other specified B group vitamins: Secondary | ICD-10-CM | POA: Diagnosis not present

## 2022-11-18 DIAGNOSIS — E559 Vitamin D deficiency, unspecified: Secondary | ICD-10-CM | POA: Diagnosis not present

## 2022-11-18 DIAGNOSIS — E669 Obesity, unspecified: Secondary | ICD-10-CM

## 2022-11-18 DIAGNOSIS — R000 Tachycardia, unspecified: Secondary | ICD-10-CM | POA: Insufficient documentation

## 2022-11-18 MED ORDER — VITAMIN D (ERGOCALCIFEROL) 1.25 MG (50000 UNIT) PO CAPS
50000.0000 [IU] | ORAL_CAPSULE | ORAL | 0 refills | Status: DC
Start: 2022-11-18 — End: 2022-11-18

## 2022-11-18 MED ORDER — SEMAGLUTIDE-WEIGHT MANAGEMENT 1 MG/0.5ML ~~LOC~~ SOAJ
1.0000 mg | SUBCUTANEOUS | 0 refills | Status: DC
Start: 2022-11-18 — End: 2022-11-18

## 2022-11-18 MED ORDER — SEMAGLUTIDE-WEIGHT MANAGEMENT 1 MG/0.5ML ~~LOC~~ SOAJ
1.0000 mg | SUBCUTANEOUS | 0 refills | Status: DC
  Filled 2022-11-18: qty 2, 28d supply, fill #0

## 2022-11-18 MED ORDER — VITAMIN D (ERGOCALCIFEROL) 1.25 MG (50000 UNIT) PO CAPS
50000.0000 [IU] | ORAL_CAPSULE | ORAL | 0 refills | Status: DC
Start: 2022-11-18 — End: 2022-12-16
  Filled 2022-11-18: qty 4, 28d supply, fill #0

## 2022-11-18 NOTE — Progress Notes (Addendum)
.smr  Office: [phone]  /  Fax: [phone]  WEIGHT SUMMARY AND BIOMETRICS  Vitals Temp: 98.1 F (36.7 C) BP: 122/73 Pulse Rate: (!) 120 SpO2: 95 %   Anthropometric Measurements Height: 5\' 2"  (1.575 m) Weight: 180 lb (81.6 kg) BMI (Calculated): 32.91 Weight at Last Visit: 181 lb Weight Lost Since Last Visit: 1 lb Weight Gained Since Last Visit: 0 Starting Weight: 181 lb Total Weight Loss (lbs): 1 lb (0.454 kg) Peak Weight: 181 lb   Body Composition  Body Fat %: 40.1 % Fat Mass (lbs): 72.6 lbs Muscle Mass (lbs): 102.8 lbs Total Body Water (lbs): 71 lbs Visceral Fat Rating : 11   Other Clinical Data Fasting: no Labs: no Today's Visit #: 14 Starting Date: 12/27/21     HPI  Chief Complaint: OBESITY  Rachael [name] is here to discuss her progress with her obesity treatment plan. She is on the the Category 1 Plan and states she is following her eating plan approximately 95 % of the time. She states she is exercising personal trainer/gym 45-90 minutes 2-4 times per week.   Interval History:  Since last office visit she is down 1 lb.  The patient, who is being treated for obesity, hyperlipidemia, and vitamin D deficiency, presents for a follow-up visit. She is currently on Wegovy for obesity, Crestor for hyperlipidemia, and ergocalciferol for vitamin D deficiency. Despite adhering to her treatment plan and maintaining regular exercise, she has plateaued in her weight loss. She reports no side effects from her medications. In addition to her prescribed medications, she is taking over-the-counter B12 supplements. She has a few upcoming events but does not anticipate these will significantly disrupt her diet. Her heart rate was elevated during the visit, which may be due to recent exercise or a side effect of her medication. She is due for a colonoscopy, which she plans to schedule.  Pharmacotherapy: Wegovy 0.5 mg weekly. Denies mass in neck, dysphagia, dyspepsia, persistent  hoarseness, abdominal pain, or N/V/Constipation or diarrhea. Has annual eye exam. Mood is stable.   Labs obtained today in follow up.  She was informed we would discuss her lab results at her next visit unless there is a critical issue that needs to be addressed sooner. She agreed to keep her next visit at the agreed upon time to discuss these results.   TREATMENT PLAN FOR OBESITY: Obesity Plateau in weight loss despite adherence to exercise and nutrition plan. Currently on Wegovy 0.5mg  with good tolerance and no side effects. Discussed potential non-responder status and alternative medication options. -Increase Wegovy to 1mg . Mildly tachycardic, so monitor closely with increased dose.  -Consider switching to Zepbound (tirzepatide) if no improvement in weight loss at next visit, pending insurance coverage. Recommended Dietary Goals  [name] is currently in the action stage of change. As such, her goal is to continue weight management plan. She has agreed to the Category 1 Plan.  Behavioral Intervention  We discussed the following Behavioral Modification Strategies today: continue to work on maintaining a reduced calorie state, getting the recommended amount of protein, incorporating whole foods, making healthy choices, staying well hydrated and practicing mindfulness when eating..  Additional resources provided today: NA  Recommended Physical Activity Goals  [name] has been advised to work up to 150 minutes of moderate intensity aerobic activity a week and strengthening exercises 2-3 times per week for cardiovascular health, weight loss maintenance and preservation of muscle mass.   She has agreed to Continue current level of physical activity  and Increase  physical activity in their day and reduce sedentary time (increase NEAT).   Pharmacotherapy We discussed various medication options to help [name] with her weight loss efforts and we both agreed to increase Wegovy to 1 mg weekly for  hyperlipidemia as primary indication.     Return in about 4 weeks (around 12/16/2022).Marland Kitchen She was informed of the importance of frequent follow up visits to maximize her success with intensive lifestyle modifications for her multiple health conditions.  PHYSICAL EXAM:  Blood pressure 122/73, pulse (!) 120, temperature 98.1 F (36.7 C), height 5\' 2"  (1.575 m), weight 180 lb (81.6 kg), last menstrual period 09/22/2012, SpO2 95%. Body mass index is 32.92 kg/m.  General: She is overweight, cooperative, alert, well developed, and in no acute distress. PSYCH: Has normal mood, affect and thought process.   Cardiovascular: HR 120 Recheck by provider 96- asymptomatic. BP 122/73 Lungs: Normal breathing effort, no conversational dyspnea. Neuro: no focal deficits  DIAGNOSTIC DATA REVIEWED:  BMET    Component Value Date/Time   NA 142 12/27/2021 0920   K 3.7 12/27/2021 0920   CL 103 12/27/2021 0920   CO2 24 12/27/2021 0920   GLUCOSE 94 12/27/2021 0920   BUN 6 (L) 12/27/2021 0920   CREATININE 0.56 (L) 12/27/2021 0920   CALCIUM 9.5 12/27/2021 0920   Lab Results  Component Value Date   HGBA1C 6.1 (H) 12/27/2021   Lab Results  Component Value Date   INSULIN 12.1 12/27/2021   Lab Results  Component Value Date   TSH 1.450 12/27/2021   CBC    Component Value Date/Time   WBC 4.5 12/27/2021 0920   RBC 4.49 12/27/2021 0920   HGB 12.0 12/27/2021 0920   HCT 39.0 12/27/2021 0920   PLT 213 12/27/2021 0920   MCV 87 12/27/2021 0920   MCH 26.7 12/27/2021 0920   MCHC 30.8 (L) 12/27/2021 0920   RDW 14.0 12/27/2021 0920   Iron Studies No results found for: "IRON", "TIBC", "FERRITIN", "IRONPCTSAT" Lipid Panel     Component Value Date/Time   CHOL 143 12/27/2021 0920   TRIG 79 12/27/2021 0920   HDL 50 12/27/2021 0920   LDLCALC 78 12/27/2021 0920   Hepatic Function Panel     Component Value Date/Time   PROT 7.3 12/27/2021 0920   ALBUMIN 4.9 12/27/2021 0920   AST 21 12/27/2021 0920    ALT 22 12/27/2021 0920   ALKPHOS 79 12/27/2021 0920   BILITOT 0.3 12/27/2021 0920      Component Value Date/Time   TSH 1.450 12/27/2021 0920   Nutritional Lab Results  Component Value Date   VD25OH 29.8 (L) 12/27/2021    ASSOCIATED CONDITIONS ADDRESSED TODAY  ASSESSMENT AND PLAN  Problem List Items Addressed This Visit     Hyperlipidemia - Primary   Relevant Medications   Semaglutide-Weight Management 1 MG/0.5ML SOAJ   Vitamin D deficiency   Relevant Medications   Vitamin D, Ergocalciferol, (DRISDOL) 1.25 MG (50000 UNIT) CAPS capsule   Other Relevant Orders   VITAMIN D 25 Hydroxy (Vit-D Deficiency, Fractures)   Prediabetes   Relevant Orders   CMP14+EGFR   Hemoglobin A1c   Generalized obesity- Start BMI 33.11   Relevant Medications   Semaglutide-Weight Management 1 MG/0.5ML SOAJ   Vitamin B 12 deficiency   Relevant Orders   Vitamin B12   CBC with Differential/Platelet   Hyperlipidemia LDL is not at goal. Of < 70.  Medication(s): rosuvastatin 10 mg daily. No side effects.  Cardiovascular risk factors: dyslipidemia, family history of  premature cardiovascular disease, and obesity (BMI >= 30 kg/m2)  Lab Results  Component Value Date   CHOL 143 12/27/2021   HDL 50 12/27/2021   LDLCALC 78 12/27/2021   TRIG 79 12/27/2021   Lab Results  Component Value Date   ALT 22 12/27/2021   AST 21 12/27/2021   ALKPHOS 79 12/27/2021   BILITOT 0.3 12/27/2021   The 10-year ASCVD risk score (Arnett DK, et al., 2019) is: 3.8%   Values used to calculate the score:     Age: 62 years     Sex: Female     Is Non-Hispanic African American: Yes     Diabetic: No     Tobacco smoker: No     Systolic Blood Pressure: 122 mmHg     Is BP treated: No     HDL Cholesterol: 50 mg/dL     Total Cholesterol: 143 mg/dL  Plan: Continue statin and Wegovy for CV risk reduction. Monitor HR closely. Mildly tachycardic, ? Related to GLP-1 RA therapy.  Increase Wegovy to 1 mg weekly and monitor  closely.  Continue to work on nutrition plan -decreasing simple carbohydrates, increasing lean proteins, decreasing saturated fats and cholesterol , avoiding trans fats and exercise as able to promote weight loss, improve lipids and decrease cardiovascular risks.  Prediabetes Last A1c was 6.1- not at goal. Insulin 12.1- not at goal.   Medication(s): Wegovy 0.50 mg SQ weekly Denies mass in neck, dysphagia, dyspepsia, persistent hoarseness, abdominal pain, or N/V/Constipation or diarrhea. Has annual eye exam. Mood is stable.   Polyphagia:No She is working on nutrition plan to decrease simple carbohydrates, increase lean proteins and exercise to promote weight loss, improve glycemic control and prevent progression to Type 2 diabetes.   Lab Results  Component Value Date   HGBA1C 6.1 (H) 12/27/2021   Lab Results  Component Value Date   INSULIN 12.1 12/27/2021    Plan: Continue and increase dose Wegovy 1.0 mg SQ weekly with primary indication of hyperlipidemia.  Continue working on nutrition plan to decrease simple carbohydrates, increase lean proteins and exercise to promote weight loss, improve glycemic control and prevent progression to Type 2 diabetes.  Recheck A1c and CMET today.   Vitamin D deficiency Managed with ergocalciferol 50,000 units once weekly. No N/V or muscle weakness with Ergocalciferol.  Plan: . -Continue/refill  ergocalciferol 50,000 units weekly. Low vitamin D levels can be associated with adiposity and may result in leptin resistance and weight gain. Also associated with fatigue. Currently on vitamin D supplementation without any adverse effects.  -Check vitamin D level today.  Vitamin B12 supplementation Patient self-started over-the-counter B12 supplementation as concerned about low levels due to fatigue. Mildly tachycardic as noted, so will check CBC as well.  Due colonoscopy this year, but has not scheduled.  Plan:. -Check B12 level today and advise on  supplementation. Also check CBC as noted .  Tachycardia: ? Related to GLP-1 RA therapy vs other . No recent CBC.  Will recheck CBC today. Advised to hydrate well.  Monitor closely with titration of Wegovy.   General Health Maintenance: -Order labs today A1C, vitamin D, B12, CBC with diff, chemistries. -Advise patient to schedule colonoscopy (due this year).  -Follow-up appointment in 4 weeks on a Monday at 3:15pm. ATTESTASTION STATEMENTS:  Reviewed by clinician on day of visit: allergies, medications, problem list, medical history, surgical history, family history, social history, and previous encounter notes.   I have personally spent 39 minutes total time today in preparation, patient  care, nutritional counseling and documentation for this visit, including the following: review of clinical lab tests; review of medical tests/procedures/services.      Hadyn Blanck, PA-C

## 2022-11-19 ENCOUNTER — Other Ambulatory Visit (HOSPITAL_BASED_OUTPATIENT_CLINIC_OR_DEPARTMENT_OTHER): Payer: Self-pay

## 2022-11-19 ENCOUNTER — Encounter (INDEPENDENT_AMBULATORY_CARE_PROVIDER_SITE_OTHER): Payer: Self-pay | Admitting: Physician Assistant

## 2022-11-19 LAB — CMP14+EGFR
ALT: 22 [IU]/L (ref 0–32)
AST: 16 [IU]/L (ref 0–40)
Albumin: 4.6 g/dL (ref 3.9–4.9)
Alkaline Phosphatase: 76 [IU]/L (ref 44–121)
BUN/Creatinine Ratio: 16 (ref 12–28)
BUN: 12 mg/dL (ref 8–27)
Bilirubin Total: 0.3 mg/dL (ref 0.0–1.2)
CO2: 21 mmol/L (ref 20–29)
Calcium: 9.7 mg/dL (ref 8.7–10.3)
Chloride: 105 mmol/L (ref 96–106)
Creatinine, Ser: 0.75 mg/dL (ref 0.57–1.00)
Globulin, Total: 2.4 g/dL (ref 1.5–4.5)
Glucose: 87 mg/dL (ref 70–99)
Potassium: 4 mmol/L (ref 3.5–5.2)
Sodium: 142 mmol/L (ref 134–144)
Total Protein: 7 g/dL (ref 6.0–8.5)
eGFR: 91 mL/min/{1.73_m2} (ref >59)

## 2022-11-19 LAB — CBC WITH DIFFERENTIAL/PLATELET
Basophils Absolute: 0 10*3/uL (ref 0.0–0.2)
Basos: 0 %
EOS (ABSOLUTE): 0.1 10*3/uL (ref 0.0–0.4)
Eos: 1 %
Hematocrit: 39 % (ref 34.0–46.6)
Hemoglobin: 12.2 g/dL (ref 11.1–15.9)
Immature Grans (Abs): 0 10*3/uL (ref 0.0–0.1)
Immature Granulocytes: 0 %
Lymphocytes Absolute: 2 10*3/uL (ref 0.7–3.1)
Lymphs: 31 %
MCH: 28.1 pg (ref 26.6–33.0)
MCHC: 31.3 g/dL — ABNORMAL LOW (ref 31.5–35.7)
MCV: 90 fL (ref 79–97)
Monocytes Absolute: 0.4 10*3/uL (ref 0.1–0.9)
Monocytes: 6 %
Neutrophils Absolute: 3.9 10*3/uL (ref 1.4–7.0)
Neutrophils: 62 %
Platelets: 256 10*3/uL (ref 150–450)
RBC: 4.34 x10E6/uL (ref 3.77–5.28)
RDW: 13.5 % (ref 11.7–15.4)
WBC: 6.4 10*3/uL (ref 3.4–10.8)

## 2022-11-19 LAB — VITAMIN D 25 HYDROXY (VIT D DEFICIENCY, FRACTURES): Vit D, 25-Hydroxy: 75.2 ng/mL (ref 30.0–100.0)

## 2022-11-19 LAB — VITAMIN B12: Vitamin B-12: 1261 pg/mL — ABNORMAL HIGH (ref 232–1245)

## 2022-11-19 LAB — HEMOGLOBIN A1C
Est. average glucose Bld gHb Est-mCnc: 126 mg/dL
Hgb A1c MFr Bld: 6 % — ABNORMAL HIGH (ref 4.8–5.6)

## 2022-12-16 ENCOUNTER — Encounter (INDEPENDENT_AMBULATORY_CARE_PROVIDER_SITE_OTHER): Payer: Self-pay | Admitting: Physician Assistant

## 2022-12-16 ENCOUNTER — Ambulatory Visit (INDEPENDENT_AMBULATORY_CARE_PROVIDER_SITE_OTHER): Payer: 59 | Admitting: Physician Assistant

## 2022-12-16 ENCOUNTER — Other Ambulatory Visit (HOSPITAL_BASED_OUTPATIENT_CLINIC_OR_DEPARTMENT_OTHER): Payer: Self-pay

## 2022-12-16 VITALS — BP 110/75 | HR 79 | Temp 98.1°F | Ht 62.0 in | Wt 177.0 lb

## 2022-12-16 DIAGNOSIS — E785 Hyperlipidemia, unspecified: Secondary | ICD-10-CM | POA: Diagnosis not present

## 2022-12-16 DIAGNOSIS — E559 Vitamin D deficiency, unspecified: Secondary | ICD-10-CM

## 2022-12-16 DIAGNOSIS — Z6832 Body mass index (BMI) 32.0-32.9, adult: Secondary | ICD-10-CM

## 2022-12-16 DIAGNOSIS — R7303 Prediabetes: Secondary | ICD-10-CM | POA: Diagnosis not present

## 2022-12-16 DIAGNOSIS — E669 Obesity, unspecified: Secondary | ICD-10-CM

## 2022-12-16 DIAGNOSIS — E7849 Other hyperlipidemia: Secondary | ICD-10-CM

## 2022-12-16 MED ORDER — FOLIC ACID 1 MG PO TABS
1.0000 mg | ORAL_TABLET | Freq: Every day | ORAL | 0 refills | Status: AC
  Filled 2022-12-16: qty 30, 30d supply, fill #0

## 2022-12-16 MED ORDER — VITAMIN D (ERGOCALCIFEROL) 1.25 MG (50000 UNIT) PO CAPS
50000.0000 [IU] | ORAL_CAPSULE | ORAL | 0 refills | Status: AC
Start: 2022-12-16 — End: ?
  Filled 2022-12-16: qty 2, 28d supply, fill #0

## 2022-12-16 MED ORDER — SEMAGLUTIDE-WEIGHT MANAGEMENT 1 MG/0.5ML ~~LOC~~ SOAJ
1.0000 mg | SUBCUTANEOUS | 0 refills | Status: DC
Start: 2022-12-16 — End: 2023-01-13
  Filled 2022-12-16 – 2022-12-18 (×2): qty 2, 28d supply, fill #0

## 2022-12-16 NOTE — Progress Notes (Signed)
.smr  Office: [phone]  /  Fax: [phone]  WEIGHT SUMMARY AND BIOMETRICS  Vitals Temp: 98.1 F (36.7 C) BP: 110/75 Pulse Rate: 79 SpO2: 100 %   Anthropometric Measurements Height: 5\' 2"  (1.575 m) Weight: 177 lb (80.3 kg) BMI (Calculated): 32.37 Weight at Last Visit: 180 lb Weight Lost Since Last Visit: 3 lb Weight Gained Since Last Visit: 0 Starting Weight: 181 lb Total Weight Loss (lbs): 4 lb (1.814 kg) Peak Weight: 181 lb   Body Composition  Body Fat %: 42.8 % Fat Mass (lbs): 76 lbs Muscle Mass (lbs): 96.6 lbs Total Body Water (lbs): 66.6 lbs Visceral Fat Rating : 12   Other Clinical Data Fasting: no Labs: no Today's Visit #: 15 Starting Date: 12/27/21     HPI  Chief Complaint: OBESITY  Rachael [name] is here to discuss her progress with her obesity treatment plan. She is on the the Category 1 Plan and states she is following her eating plan approximately 95 % of the time. She states she is exercising with personal trainer/treadmill 45  minutes 2-3 times per week.   Interval History:  Since last office visit she is down 3 lbs.   The patient, with a history of obesity, hyperlipidemia, prediabetes, and vitamin D deficiency, presents for a follow-up visit.  She has been on [facility] for the management of hyperlipidemia.  Weight loss of three pounds since the last visit.  She has been maintaining her physical activity with a personal trainer twice a week. She reports feeling good overall and has not experienced any adverse effects such as nausea, vomiting, diarrhea, or constipation from the medication. She has not noticed any difficulty swallowing or feeling like she has a lump in her throat.  She has been taking vitamin D supplements and has requested a prescription for folic acid as her supply has run out. She reports good energy levels and sleep.  She has been participating in a weekly support group which has been beneficial for her mental health and  motivation.  Pharmacotherapy: Wegovy 1 mg weekly. Denies mass in neck, dysphagia, dyspepsia, persistent hoarseness, abdominal pain, or N/V/Constipation or diarrhea. Has annual eye exam. Mood is stable.   TREATMENT PLAN FOR OBESITY: Obesity Continued weight loss with Wegovy 1mg . No reported side effects. Noted some loss of muscle mass and slight increase in adipose tissue despite personal training sessions. -Continue Wegovy 1mg  for weight management. -Continue personal training sessions twice a week. -Recheck body composition in 1 month. Recommended Dietary Goals  Rachael [name] is currently in the action stage of change. As such, her goal is to continue weight management plan. She has agreed to the Category 1 Plan.  Behavioral Intervention  We discussed the following Behavioral Modification Strategies today: continue to work on maintaining a reduced calorie state, getting the recommended amount of protein, incorporating whole foods, making healthy choices, staying well hydrated and practicing mindfulness when eating..  Additional resources provided today: NA  Recommended Physical Activity Goals  Rachael [name] has been advised to work up to 150 minutes of moderate intensity aerobic activity a week and strengthening exercises 2-3 times per week for cardiovascular health, weight loss maintenance and preservation of muscle mass.   She has agreed to Continue current level of physical activity  and Increase physical activity in their day and reduce sedentary time (increase NEAT).   Pharmacotherapy We discussed various medication options to help Rachael [name] with her weight loss efforts and we both agreed to continue Wegovy 1 mg weekly for hyperlipidemia.  Return in about 4 weeks (around 01/13/2023).Marland Kitchen She was informed of the importance of frequent follow up visits to maximize her success with intensive lifestyle modifications for her multiple health conditions.  PHYSICAL EXAM:  Blood pressure 110/75, pulse 79,  temperature 98.1 F (36.7 C), height 5\' 2"  (1.575 m), weight 177 lb (80.3 kg), last menstrual period 09/22/2012, SpO2 100%. Body mass index is 32.37 kg/m.  General: She is overweight, cooperative, alert, well developed, and in no acute distress. PSYCH: Has normal mood, affect and thought process.   Cardiovascular: HR 70's BP 110/75 Lungs: Normal breathing effort, no conversational dyspnea. Neuro: no focal deficits  DIAGNOSTIC DATA REVIEWED:  BMET    Component Value Date/Time   NA 142 11/18/2022 1551   K 4.0 11/18/2022 1551   CL 105 11/18/2022 1551   CO2 21 11/18/2022 1551   GLUCOSE 87 11/18/2022 1551   BUN 12 11/18/2022 1551   CREATININE 0.75 11/18/2022 1551   CALCIUM 9.7 11/18/2022 1551   Lab Results  Component Value Date   HGBA1C 6.0 (H) 11/18/2022   HGBA1C 6.1 (H) 12/27/2021   Lab Results  Component Value Date   INSULIN 12.1 12/27/2021   Lab Results  Component Value Date   TSH 1.450 12/27/2021   CBC    Component Value Date/Time   WBC 6.4 11/18/2022 1551   RBC 4.34 11/18/2022 1551   HGB 12.2 11/18/2022 1551   HCT 39.0 11/18/2022 1551   PLT 256 11/18/2022 1551   MCV 90 11/18/2022 1551   MCH 28.1 11/18/2022 1551   MCHC 31.3 (L) 11/18/2022 1551   RDW 13.5 11/18/2022 1551   Iron Studies No results found for: "IRON", "TIBC", "FERRITIN", "IRONPCTSAT" Lipid Panel     Component Value Date/Time   CHOL 143 12/27/2021 0920   TRIG 79 12/27/2021 0920   HDL 50 12/27/2021 0920   LDLCALC 78 12/27/2021 0920   Hepatic Function Panel     Component Value Date/Time   PROT 7.0 11/18/2022 1551   ALBUMIN 4.6 11/18/2022 1551   AST 16 11/18/2022 1551   ALT 22 11/18/2022 1551   ALKPHOS 76 11/18/2022 1551   BILITOT 0.3 11/18/2022 1551      Component Value Date/Time   TSH 1.450 12/27/2021 0920   Nutritional Lab Results  Component Value Date   VD25OH 75.2 11/18/2022   VD25OH 29.8 (L) 12/27/2021    ASSOCIATED CONDITIONS ADDRESSED TODAY  ASSESSMENT AND  PLAN  Problem List Items Addressed This Visit     Hyperlipidemia - Primary   Relevant Medications   Semaglutide-Weight Management 1 MG/0.5ML SOAJ   Vitamin D deficiency   Relevant Medications   Vitamin D, Ergocalciferol, (DRISDOL) 1.25 MG (50000 UNIT) CAPS capsule   Prediabetes   Generalized obesity- Start BMI 33.11   Relevant Medications   Semaglutide-Weight Management 1 MG/0.5ML SOAJ   BMI 32.0-32.9,adult    Hyperlipidemia Labs were reviewed today and discussed with the patient.  LDL is not at goal. Medication(s): Crestor 10 mg daily. No side effects and  Wegovy 1 mg weekly. Denies mass in neck, dysphagia, dyspepsia, persistent hoarseness, abdominal pain, or N/V/Constipation or diarrhea. Has annual eye exam. Mood is stable.   Cardiovascular risk factors: dyslipidemia and obesity (BMI >= 30 kg/m2)  Lab Results  Component Value Date   CHOL 143 12/27/2021   HDL 50 12/27/2021   LDLCALC 78 12/27/2021   TRIG 79 12/27/2021   Lab Results  Component Value Date   ALT 22 11/18/2022   AST 16 11/18/2022  ALKPHOS 76 11/18/2022   BILITOT 0.3 11/18/2022   The 10-year ASCVD risk score (Arnett DK, et al., 2019) is: 2.9%   Values used to calculate the score:     Age: 35 years     Sex: Female     Is Non-Hispanic African American: Yes     Diabetic: No     Tobacco smoker: No     Systolic Blood Pressure: 110 mmHg     Is BP treated: No     HDL Cholesterol: 50 mg/dL     Total Cholesterol: 143 mg/dL  Plan: Continue Crestor 10 mg daily Refill and continue Wegovy 1 mg weekly for hyperlipidemia to reduce CV risks.  Continue to work on nutrition plan -decreasing simple carbohydrates, increasing lean proteins, decreasing saturated fats and cholesterol , avoiding trans fats and exercise as able to promote weight loss, improve lipids and decrease cardiovascular risks.   Prediabetes Labs were reviewed today and discussed with the patient.  Last A1c was 6.0/ Insulin 12.1- not at goals.    Medication(s): Wegovy 1.0 mg SQ weekly Polyphagia:No Lab Results  Component Value Date   HGBA1C 6.0 (H) 11/18/2022   HGBA1C 6.1 (H) 12/27/2021   Lab Results  Component Value Date   INSULIN 12.1 12/27/2021    Plan: Continue and refill Wegovy 1.0 mg SQ weekly Continue working on nutrition plan to decrease simple carbohydrates, increase lean proteins and exercise to promote weight loss, improve glycemic control and prevent progression to Type 2 diabetes.    Vitamin D Deficiency Labs were reviewed today and discussed with the patient.  Vitamin D is at goal of 50.  Most recent vitamin D level was 75.2. She is on  prescription ergocalciferol 50,000 IU weekly. No N/V or muscle weakness with Ergocalciferol.   Lab Results  Component Value Date   VD25OH 75.2 11/18/2022   VD25OH 29.8 (L) 12/27/2021    Plan: Continue and decrease dose  prescription ergocalciferol 50,000 IU every 14 days Low vitamin D levels can be associated with adiposity and may result in leptin resistance and weight gain. Also associated with fatigue. Currently on vitamin D supplementation without any adverse effects.    Folate deficiency Patient reports running out of folic acid. -Refill  folic acid 1mg  daily x 1 month unti can follow up with PCP.   General Health Maintenance / Followup Plans -Return visit in 1 month on December 2nd. -Continue participation in weekly support group.   ATTESTASTION STATEMENTS:  Reviewed by clinician on day of visit: allergies, medications, problem list, medical history, surgical history, family history, social history, and previous encounter notes.   I have personally spent 30 minutes total time today in preparation, patient care, nutritional counseling and documentation for this visit, including the following: review of clinical lab tests; review of medical tests/procedures/services.      Itay Mella, PA-C

## 2022-12-18 ENCOUNTER — Other Ambulatory Visit (HOSPITAL_BASED_OUTPATIENT_CLINIC_OR_DEPARTMENT_OTHER): Payer: Self-pay

## 2022-12-23 ENCOUNTER — Encounter: Payer: Self-pay | Admitting: Internal Medicine

## 2023-01-03 ENCOUNTER — Encounter: Payer: Self-pay | Admitting: Internal Medicine

## 2023-01-13 ENCOUNTER — Encounter (INDEPENDENT_AMBULATORY_CARE_PROVIDER_SITE_OTHER): Payer: Self-pay | Admitting: Physician Assistant

## 2023-01-13 ENCOUNTER — Other Ambulatory Visit (HOSPITAL_BASED_OUTPATIENT_CLINIC_OR_DEPARTMENT_OTHER): Payer: Self-pay

## 2023-01-13 ENCOUNTER — Ambulatory Visit (INDEPENDENT_AMBULATORY_CARE_PROVIDER_SITE_OTHER): Payer: 59 | Admitting: Physician Assistant

## 2023-01-13 VITALS — BP 116/75 | HR 104 | Temp 97.9°F | Ht 62.0 in | Wt 178.0 lb

## 2023-01-13 DIAGNOSIS — E669 Obesity, unspecified: Secondary | ICD-10-CM

## 2023-01-13 DIAGNOSIS — E785 Hyperlipidemia, unspecified: Secondary | ICD-10-CM

## 2023-01-13 DIAGNOSIS — E7849 Other hyperlipidemia: Secondary | ICD-10-CM

## 2023-01-13 DIAGNOSIS — R7303 Prediabetes: Secondary | ICD-10-CM | POA: Diagnosis not present

## 2023-01-13 DIAGNOSIS — Z6832 Body mass index (BMI) 32.0-32.9, adult: Secondary | ICD-10-CM

## 2023-01-13 DIAGNOSIS — E559 Vitamin D deficiency, unspecified: Secondary | ICD-10-CM | POA: Diagnosis not present

## 2023-01-13 MED ORDER — SEMAGLUTIDE (2 MG/DOSE) 8 MG/3ML ~~LOC~~ SOPN
2.0000 mg | PEN_INJECTOR | SUBCUTANEOUS | 0 refills | Status: DC
Start: 2023-01-13 — End: 2023-02-13
  Filled 2023-01-13: qty 3, 28d supply, fill #0

## 2023-01-13 NOTE — Progress Notes (Signed)
SUBJECTIVE:  Chief Complaint: Obesity Discussed the use of AI scribe software for clinical note transcription with the patient, who gave verbal consent to proceed.  History of Present Illness     Interim History: She is up 1 lbs since her last visit.  Rachael [name], a 62 year old patient with a history of obesity, hyperlipidemia, vitamin D deficiency, and prediabetes, presents for a follow-up visit regarding her obesity treatment plan. She has been on semaglutide 1mg  weekly and Crestor 10mg  daily for hyperlipidemia, and ergocalciferol every 14 days for vitamin D deficiency. The patient's last A1c was 6.0, and she is not on any other medications for prediabetes.  The patient reports being sick for two weeks, during which she was unable to move. She sought care at a walk-in clinic, where she was diagnosed with a bacterial or viral infection. The patient has since recovered. She was not able to exercise during this period, but did continue her usual medications.   The patient's exercise routine was disrupted due to her illness, and she is currently trying to manage her exercise at home due to financial constraints. Prior to her illness, she was engaging in regular exercise with a Systems analyst. The patient also reports maintaining a diet high in protein, including string cheese, cottage cheese, and protein shakes.  Rachael [name] is here to discuss her progress with her obesity treatment plan. She is on the Category 1 Plan and states she is following her eating plan approximately 60 % of the time. She states she is not exercising as she was sick for a couple of weeks but plans to resume home exercise- Discussed you tube videos- Walking with Verlon Au and has free weights to work on Print production planner.     OBJECTIVE: Visit Diagnoses: Problem List Items Addressed This Visit     Hyperlipidemia - Primary   Relevant Medications   Semaglutide, 2 MG/DOSE, 8 MG/3ML SOPN   Vitamin D deficiency   Prediabetes    Generalized obesity- Start BMI 33.11   Relevant Medications   Semaglutide, 2 MG/DOSE, 8 MG/3ML SOPN   BMI 32.0-32.9,adult  Obesity 62 year old on semaglutide 1 mg weekly with no significant progress. No missed doses or adverse effects. Plan to increase semaglutide to 2 mg weekly to enhance weight loss and monitor closely. She has lost some muscle mass as had been sick with a URI for a couple of week and unable to work with trainer at this time due to financial constraints. Denies mass in neck, dysphagia, dyspepsia, persistent hoarseness, abdominal pain, or N/V/Constipation or diarrhea. Has annual eye exam. Mood is stable. Some intermittent HR> 100 noted, but not consistent. Denies any chest pain, SOB, palpitations or other symptoms associated with tachycardia. Reports does well with hydration, drinks water regularly. No other signs for dehydration.   Will monitor closely.  Discussed potential nausea with increased dose and advised regular meals, avoiding fatty foods, and staying hydrated. Encouraged increased physical activity, including walking exercises with [employer]. - Increase semaglutide to 2 mg weekly - Advise regular meals, avoid fatty foods, and stay hydrated - Encourage increased physical activity, including walking exercises with [employer] - Schedule follow-up on January 2nd at 2:30 PM  Hyperlipidemia The 10-year ASCVD risk score (Arnett DK, et al., 2019) is: 3.3%   Values used to calculate the score:     Age: 7 years     Sex: Female     Is Non-Hispanic African American: Yes     Diabetic: No     Tobacco smoker:  No     Systolic Blood Pressure: 116 mmHg     Is BP treated: No     HDL Cholesterol: 50 mg/dL     Total Cholesterol: 143 mg/dL  On Crestor 10 mg daily, effective. Also on semaglutide for hyperlipidemia management. - Continue Crestor 10 mg daily prescribed by PCP.  Continue/increase/refill semaglutide to 2 mg weekly and monitor response.  Continue to work on  nutrition plan -decreasing simple carbohydrates, increasing lean proteins, decreasing saturated fats and cholesterol , avoiding trans fats and exercise as able to promote weight loss, improve lipids and decrease cardiovascular risks.   Prediabetes Lab Results  Component Value Date   HGBA1C 6.0 (H) 11/18/2022   HGBA1C 6.1 (H) 12/27/2021   Lab Results  Component Value Date   LDLCALC 78 12/27/2021   CREATININE 0.75 11/18/2022    Last A1c at 6.0. No current medications but on semaglutide for hyperlipidemia. Encouraged lifestyle modifications including diet and exercise. - Monitor A1c levels regularly -Continue working on nutrition plan to decrease simple carbohydrates, increase lean proteins and exercise to promote weight loss, improve glycemic control and prevent progression to Type 2 diabetes.    Vitamin D Deficiency Resolved with ergocalciferol supplementation. On ergocalciferol every 14 days. Plan to recheck vitamin D levels after current course of 12 capsules. - Continue ergocalciferol every 14 days until this course completed.  - Recheck vitamin D levels after current course of 12 capsules to optimize supplementation/avoid over supplementation.   General Health Maintenance Discussed importance of physical activity and adequate protein intake. Reports adequate hydration and protein intake through diet and supplements. Encouraged regular physical activity, including strength training exercises with dumbbells. - Encourage at least 75 grams of protein daily - Encourage regular physical activity, including strength training exercises with dumbbells - Advise to schedule and adhere to regular exercise sessions  Follow-up - Follow-up on January 2nd at 2:30 PM.  Vitals Temp: 97.9 F (36.6 C) BP: 116/75 Pulse Rate: (!) 104 SpO2: 99 %   Anthropometric Measurements Height: 5\' 2"  (1.575 m) Weight: 178 lb (80.7 kg) BMI (Calculated): 32.55 Weight at Last Visit: 177 lb Weight Lost  Since Last Visit: 0 Weight Gained Since Last Visit: 1 lb Starting Weight: 181 lb Total Weight Loss (lbs): 3 lb (1.361 kg) Peak Weight: 181 lb   Body Composition  Body Fat %: 43.7 % Fat Mass (lbs): 77.8 lbs Muscle Mass (lbs): 95.2 lbs Total Body Water (lbs): 67.2 lbs Visceral Fat Rating : 12   Other Clinical Data Fasting: no Labs: no Today's Visit #: 16 Starting Date: 12/27/21     ASSESSMENT AND PLAN:  Diet: Ottis is currently in the action stage of change. As such, her goal is to continue with weight loss efforts. She has agreed to Category 1 Plan.  Exercise: Leahanna has been instructed to work up to a goal of 150 minutes of combined cardio and strengthening exercise per week for weight loss and overall health benefits.   Behavior Modification:  We discussed the following Behavioral Modification Strategies today: increasing lean protein intake, decreasing simple carbohydrates, increasing vegetables, increase H2O intake, increase high fiber foods, no skipping meals, meal planning and cooking strategies, holiday eating strategies, and planning for success. We discussed various medication options to help Marykate with her weight loss efforts and we both agreed to increase semaglutide to 2 mg weekly for primary indication of hyperlipidemia.  Return in about 4 weeks (around 02/10/2023).Marland Kitchen She was informed of the importance of frequent follow up visits to  maximize her success with intensive lifestyle modifications for her multiple health conditions.  Attestation Statements:   Reviewed by clinician on day of visit: allergies, medications, problem list, medical history, surgical history, family history, social history, and previous encounter notes.   Time spent on visit including pre-visit chart review and post-visit care and charting was 33 minutes.    Curlee Bogan, PA-C

## 2023-01-16 ENCOUNTER — Telehealth (INDEPENDENT_AMBULATORY_CARE_PROVIDER_SITE_OTHER): Payer: Self-pay

## 2023-01-16 NOTE — Telephone Encounter (Signed)
 Prior auth submitted for Ozempic.  Awaiting determination.

## 2023-01-16 NOTE — Telephone Encounter (Signed)
PA for Ozempic has been denied.   Date: 01/16/2023 [facility] [address] [place], [place] [address] An initial coverage request was denied; talk to your doctor about the best option for you. Dear Rachael [name]: Request for coverage of Ozempic (semaglutide) has been denied. A request for prescription coverage for Ozempic (semaglutide) was recently submitted on your behalf. After careful consideration and review of the information sent to [place], this request was not approved. We understand that this decision may not be what you and your doctor expected. This letter and the enclosed information will explain your options and help you decide what to do next. We reviewed all the supporting information sent to [place] and used your plan's guidelines to make our decision. Why your request was denied: Your plan only covers this drug when it is used for certain health conditions. Covered use is for type 2 diabetes mellitus. Your plan does not cover the drug for your health condition that your doctor told us you have. We reviewed the information we had. Your request has been denied. Your doctor can send [place] any new or missing information for [place] to review. For this drug, you may have to meet other criteria. You can request the drug policy for more details. You can also request other plan documents for your review. We've let your doctor know about this denial, so they will be ready to work with you on your next steps or an alternative treatment plan. [facility] Enclosures cc: Dr. Ines Bloomer [facility] To learn more about the Prescription Coverage Review Process Scan the QR code or visit [email or web address] KEEP READING FOR STEPS YOU AND YOUR DOCTOR CAN TAKE

## 2023-01-23 ENCOUNTER — Other Ambulatory Visit (INDEPENDENT_AMBULATORY_CARE_PROVIDER_SITE_OTHER): Payer: Self-pay | Admitting: Physician Assistant

## 2023-01-23 DIAGNOSIS — E669 Obesity, unspecified: Secondary | ICD-10-CM

## 2023-02-08 ENCOUNTER — Other Ambulatory Visit (HOSPITAL_BASED_OUTPATIENT_CLINIC_OR_DEPARTMENT_OTHER): Payer: Self-pay

## 2023-02-13 ENCOUNTER — Ambulatory Visit (INDEPENDENT_AMBULATORY_CARE_PROVIDER_SITE_OTHER): Payer: 59 | Admitting: Physician Assistant

## 2023-02-13 ENCOUNTER — Other Ambulatory Visit (HOSPITAL_BASED_OUTPATIENT_CLINIC_OR_DEPARTMENT_OTHER): Payer: Self-pay

## 2023-02-13 ENCOUNTER — Encounter (INDEPENDENT_AMBULATORY_CARE_PROVIDER_SITE_OTHER): Payer: Self-pay | Admitting: Physician Assistant

## 2023-02-13 VITALS — BP 119/81 | HR 100 | Temp 98.3°F | Ht 62.0 in | Wt 179.0 lb

## 2023-02-13 DIAGNOSIS — R7303 Prediabetes: Secondary | ICD-10-CM

## 2023-02-13 DIAGNOSIS — Z566 Other physical and mental strain related to work: Secondary | ICD-10-CM | POA: Diagnosis not present

## 2023-02-13 DIAGNOSIS — E785 Hyperlipidemia, unspecified: Secondary | ICD-10-CM | POA: Diagnosis not present

## 2023-02-13 DIAGNOSIS — E669 Obesity, unspecified: Secondary | ICD-10-CM | POA: Diagnosis not present

## 2023-02-13 DIAGNOSIS — E7849 Other hyperlipidemia: Secondary | ICD-10-CM

## 2023-02-13 DIAGNOSIS — Z6832 Body mass index (BMI) 32.0-32.9, adult: Secondary | ICD-10-CM

## 2023-02-13 MED ORDER — WEGOVY 0.5 MG/0.5ML ~~LOC~~ SOAJ
0.5000 mg | SUBCUTANEOUS | 0 refills | Status: DC
  Filled 2023-02-13 – 2023-02-28 (×2): qty 2, 28d supply, fill #0

## 2023-02-13 NOTE — Progress Notes (Signed)
 SUBJECTIVE:  Chief Complaint: Obesity  Interim History: She is up 1 lb since her last visit.   The patient, a 63 year old individual with obesity, was seen for a follow-up visit regarding her weight management plan. She has been on Wegovy  2mg  weekly for medical weight loss. However, she reported an inability to obtain the medication for the past month due to insurance issues, which may have contributed to a slight increase in adipose tissue noted during the visit.  The patient reported increased cravings for sweets, which she attributed to the absence of the Wegovy . She also noted feeling deprived when eating healthily and expressed frustration with the pace of her weight loss. Despite these challenges, the patient expressed a desire to continue with the weight loss plan.  The patient's stress levels were notably high due to work-related pressures, which she reported as having a negative impact on her eating habits. She described a pattern of not eating when stressed, followed by episodes of vomiting when she tried to eat. The patient also reported physical discomfort, including generalized body aches, which she attributed to chronic stress.  The patient has been maintaining physical activity by walking on a treadmill. She also expressed interest in trying a high-protein, low-carb hot chocolate drink as a potential strategy to manage her sweet cravings. The patient is looking forward to restarting the Wegovy  medication and is open to adjusting her diet to include more variety in protein sources. Rachael [name] is here to discuss her progress with her obesity treatment plan. She is on the Category 1 Plan and states she is following her eating plan approximately 85 % of the time. She states she is exercising 60 minutes 3-4 times per week.   OBJECTIVE: Visit Diagnoses: Problem List Items Addressed This Visit     Hyperlipidemia - Primary   Relevant Medications   Semaglutide -Weight Management (WEGOVY )  0.5 MG/0.5ML SOAJ   Prediabetes   Generalized obesity- Start BMI 33.11   Relevant Medications   Semaglutide -Weight Management (WEGOVY ) 0.5 MG/0.5ML SOAJ   BMI 32.0-32.9,adult   Other Visit Diagnoses       Stress at work          Obesity 63 year old on Wegovy  2 mg weekly for hyperlipidema, off medication for a month due to insurance issues. Was started on Wegovy  for primary indication of hyperlipidemia. Reports increased cravings for sweets, stress-related weight gain, and feelings of deprivation when eating healthy. Discussed restarting Wegovy  at a lower dose to avoid gastrointestinal side effects, high-protein, low-calorie snack options, and stress management strategies. - Resubmit prior authorization for Wegovy  for hyperlipidema.  - Restart Wegovy  at a lower dose - Recommend Bari Wise hot chocolate as a high-protein, low-calorie snack - Encourage continuation of treadmill walking - Discuss stress management strategies and potential job-related stress reduction  Hyperlipidemia LDL is not at goal.of < 70.  Medication(s): crestor 10 mg daily. No side effects and Wegovy  for CV risk reduction. Cardiovascular risk factors: dyslipidemia and obesity (BMI >= 30 kg/m2)  Lab Results  Component Value Date   CHOL 143 12/27/2021   HDL 50 12/27/2021   LDLCALC 78 12/27/2021   TRIG 79 12/27/2021   Lab Results  Component Value Date   ALT 22 11/18/2022   AST 16 11/18/2022   ALKPHOS 76 11/18/2022   BILITOT 0.3 11/18/2022   The 10-year ASCVD risk score (Arnett DK, et al., 2019) is: 3.6%   Values used to calculate the score:     Age: 73 years     Sex:  Female     Is Non-Hispanic African American: Yes     Diabetic: No     Tobacco smoker: No     Systolic Blood Pressure: 119 mmHg     Is BP treated: No     HDL Cholesterol: 50 mg/dL     Total Cholesterol: 143 mg/dL  Plan: Continue Crestor .  Resume Wegovy  for CV risk reduction.  Continue to work on nutrition plan -decreasing simple  carbohydrates, increasing lean proteins, decreasing saturated fats and cholesterol , avoiding trans fats and exercise as able to promote weight loss, improve lipids and decrease cardiovascular risks.   Prediabetes Last A1c was 6.0  Medication(s): Wegovy  previously for hyperlipidemia but will also improve glycemic control.  Polyphagia:Yes Lab Results  Component Value Date   HGBA1C 6.0 (H) 11/18/2022   HGBA1C 6.1 (H) 12/27/2021   Lab Results  Component Value Date   INSULIN  12.1 12/27/2021    Plan:  Resume Wegovy  for hyperlipidemia which will also improve glycemic control. Continue working on nutrition plan to decrease simple carbohydrates, increase lean proteins and exercise to promote weight loss, improve glycemic control and prevent progression to Type 2 diabetes.    Stress Management Significant work-related stress impacting eating habits, sleep, and overall health. Reports stress-induced nausea and vomiting at times. Discussed potential job changes or stress management techniques. - Encourage taking available vacation days to reduce stress - Discuss potential job changes or stress management techniques - Monitor for improvement in stress levels and related symptoms  Follow-up - Schedule follow-up appointment on January 28th at 3 PM. Vitals Temp: 98.3 F (36.8 C) BP: 119/81 Pulse Rate: 100 SpO2: 100 %   Anthropometric Measurements Height: 5' 2 (1.575 m) Weight: 179 lb (81.2 kg) BMI (Calculated): 32.73 Weight at Last Visit: 178 lb Weight Lost Since Last Visit: 0 Weight Gained Since Last Visit: 1 Starting Weight: 181 lb Total Weight Loss (lbs): 2 lb (0.907 kg) Peak Weight: 181 lb   Body Composition  Body Fat %: 44.1 % Fat Mass (lbs): 79 lbs Muscle Mass (lbs): 95 lbs Total Body Water (lbs): 69.2 lbs Visceral Fat Rating : 12   Other Clinical Data Fasting: no Labs: no Today's Visit #: 17 Starting Date: 12/27/21     ASSESSMENT AND PLAN:  Diet: Rachael [name]  is currently in the action stage of change. As such, her goal is to continue with weight loss efforts. She has agreed to Category 1 Plan.  Exercise: Rachael [name] has been instructed to work up to a goal of 150 minutes of combined cardio and strengthening exercise per week for weight loss and overall health benefits.   Behavior Modification:  We discussed the following Behavioral Modification Strategies today: increasing lean protein intake, decreasing simple carbohydrates, increasing vegetables, increase H2O intake, increase high fiber foods, no skipping meals, meal planning and cooking strategies, better snacking choices, emotional eating strategies , and planning for success. We discussed various medication options to help Rachael [name] with her weight loss efforts and we both agreed to resume Wegovy  for primary indication of hyperlipidemia.  Return in about 4 weeks (around 03/13/2023).Rachael [name] She was informed of the importance of frequent follow up visits to maximize her success with intensive lifestyle modifications for her multiple health conditions.  Attestation Statements:   Reviewed by clinician on day of visit: allergies, medications, problem list, medical history, surgical history, family history, social history, and previous encounter notes.   Time spent on visit including pre-visit chart review and post-visit care and charting was 33 minutes.  Rachael Scantling, PA-C

## 2023-02-18 ENCOUNTER — Ambulatory Visit (AMBULATORY_SURGERY_CENTER): Payer: 59

## 2023-02-18 VITALS — Ht 62.0 in | Wt 178.0 lb

## 2023-02-18 DIAGNOSIS — Z1211 Encounter for screening for malignant neoplasm of colon: Secondary | ICD-10-CM

## 2023-02-18 MED ORDER — NA SULFATE-K SULFATE-MG SULF 17.5-3.13-1.6 GM/177ML PO SOLN: 1.0000 | Freq: Once | ORAL | 0 refills | Status: AC

## 2023-02-18 NOTE — Progress Notes (Signed)
 No egg or soy allergy known to patient  No issues known to pt with past sedation with any surgeries or procedures Patient denies ever being told they had issues or difficulty with intubation  No FH of Malignant Hyperthermia Pt is not on diet pills Pt is not on  home 02  Pt is not on blood thinners  Pt denies issues with constipation  No A fib or A flutter Have any cardiac testing pending-- no  LOA: independent  Prep: suprep  Pt advised on 7 day hold for wegovy    Patient's chart reviewed by Norleen Schillings CNRA prior to previsit and patient appropriate for the [facility].  Previsit completed and red dot placed by patient's name on their procedure day (on provider's schedule).     PV competed with patient. Prep instructions sent via mychart and home address. [facility] coupon for [facility] provided to use for price reduction if needed.

## 2023-02-19 ENCOUNTER — Other Ambulatory Visit (HOSPITAL_BASED_OUTPATIENT_CLINIC_OR_DEPARTMENT_OTHER): Payer: Self-pay

## 2023-02-19 ENCOUNTER — Telehealth: Payer: Self-pay

## 2023-02-19 NOTE — Telephone Encounter (Signed)
 PA submitted through Cover My Meds for [facility]. Awaiting insurance determination. Key: [id number]

## 2023-02-20 ENCOUNTER — Telehealth (INDEPENDENT_AMBULATORY_CARE_PROVIDER_SITE_OTHER): Payer: Self-pay | Admitting: *Deleted

## 2023-02-20 NOTE — Telephone Encounter (Signed)
 Patient has been approved for the Rachael [name] per insurance from 02/19/2023-09/19/2023,patient has been updated thru voice message.

## 2023-02-20 NOTE — Telephone Encounter (Signed)
 Per patients insurance Rachael [name] has been approved from 02/19/2023 to 09/19/2023.

## 2023-02-24 ENCOUNTER — Encounter: Payer: Self-pay | Admitting: Internal Medicine

## 2023-02-28 ENCOUNTER — Other Ambulatory Visit (HOSPITAL_BASED_OUTPATIENT_CLINIC_OR_DEPARTMENT_OTHER): Payer: Self-pay

## 2023-03-03 NOTE — Progress Notes (Unsigned)
[facility] History and Physical   Primary Care Physician:  Jarrett Soho, PA-C   Reason for Procedure:   CRCA screening  Plan:    colonoscopy     HPI: Rachael [name] is a 63 y.o. female presenting for a repeat screening colonoscopy - negative exam 2014.   Past Medical History:  Diagnosis Date   Anemia    Back pain    Diarrhea    Gallbladder problem    Hyperlipidemia    IBS (irritable bowel syndrome)    Joint pain    Menorrhagia    Obesity    Prediabetes    Sleep apnea    Vitamin D deficiency     Past Surgical History:  Procedure Laterality Date   BREAST REDUCTION SURGERY     CHOLECYSTECTOMY     COLONOSCOPY  09/2012   LIPOSUCTION     uterine ablation     for menorrhagia    Prior to Admission medications   Medication Sig Start Date End Date Taking? Authorizing Provider  diphenhydrAMINE (BENADRYL) 50 MG tablet Take 1 tablet (50 mg total) by mouth at bedtime as needed for sleep. 01/09/22   Glenford Bayley, NP  folic acid (FOLVITE) 1 MG tablet Take 1 tablet (1 mg total) by mouth daily. 12/16/22   [facility], Fanny Bien, PA-C  Probiotic Product (PROBIOTIC DAILY PO) Take by mouth.    [provider]  rosuvastatin (CRESTOR) 10 MG tablet Take 10 mg by mouth daily.    [provider]  Semaglutide-Weight Management (WEGOVY) 0.5 MG/0.5ML SOAJ Inject 0.5 mg into the skin once a week. 02/13/23   [facility], Fanny Bien, PA-C  Vitamin D, Ergocalciferol, (DRISDOL) 1.25 MG (50000 UNIT) CAPS capsule Take 1 capsule (50,000 Units total) by mouth every 14 (fourteen) days. 12/16/22   [facility], Fanny Bien, PA-C    Current Outpatient Medications  Medication Sig Dispense Refill   diphenhydrAMINE (BENADRYL) 50 MG tablet Take 1 tablet (50 mg total) by mouth at bedtime as needed for sleep. 30 tablet 0   folic acid (FOLVITE) 1 MG tablet Take 1 tablet (1 mg total) by mouth daily. 30 tablet 0   rosuvastatin (CRESTOR) 10 MG tablet Take 10 mg by  mouth daily.     Vitamin D, Ergocalciferol, (DRISDOL) 1.25 MG (50000 UNIT) CAPS capsule Take 1 capsule (50,000 Units total) by mouth every 14 (fourteen) days. 12 capsule 0   Probiotic Product (PROBIOTIC DAILY PO) Take by mouth.     Semaglutide-Weight Management (WEGOVY) 0.5 MG/0.5ML SOAJ Inject 0.5 mg into the skin once a week. (Patient not taking: Reported on 03/04/2023) 2 mL 0   Current Facility-Administered Medications  Medication Dose Route Frequency Provider Last Rate Last Admin   0.9 %  sodium chloride infusion  500 mL Intravenous Once Iva Boop, MD        Allergies as of 03/04/2023 - Review Complete 03/04/2023  Allergen Reaction Noted   Penicillins Other (See Comments) 09/11/2012    Family History  Problem Relation Age of Onset   Glaucoma Mother    Depression Mother    Stroke Father    Glaucoma Father    Hypertension Father    Heart disease Father    Hypertension Sister    Breast cancer Maternal Aunt    Colon cancer Neg Hx    Esophageal cancer Neg Hx    Rectal cancer Neg Hx    Stomach cancer Neg Hx     Social History   Socioeconomic History  Marital status: Married    Spouse name: Not on file   Number of children: 2   Years of education: Not on file   Highest education level: Not on file  Occupational History   Occupation: Emergency planning/management officer at [employer]  Tobacco Use   Smoking status: Never   Smokeless tobacco: Never  Vaping Use   Vaping status: Never Used  Substance and Sexual Activity   Alcohol use: No   Drug use: No   Sexual activity: Not on file  Other Topics Concern   Not on file  Social History Narrative   Not on file   Social Drivers of Health   Financial Resource Strain: Not on file  Food Insecurity: Not on file  Transportation Needs: Not on file  Physical Activity: Not on file  Stress: Not on file  Social Connections: Not on file  Intimate Partner Violence: Not on file    Review of Systems:  All other review of systems negative  except as mentioned in the HPI.  Physical Exam: Vital signs BP 128/68   Pulse 92   Temp (!) 97.2 F (36.2 C) (Temporal)   Resp 20   Ht 5\' 2"  (1.575 m)   Wt 178 lb (80.7 kg)   LMP 09/22/2012   SpO2 99%   BMI 32.56 kg/m   General:   Alert,  Well-developed, well-nourished, pleasant and cooperative in NAD Lungs:  Clear throughout to auscultation.   Heart:  Regular rate and rhythm; no murmurs, clicks, rubs,  or gallops. Abdomen:  Soft, nontender and nondistended. Normal bowel sounds.   Neuro/Psych:  Alert and cooperative. Normal mood and affect. A and O x 3   @Janiesha Diehl  Sena Slate, MD, [facility] [phone] (pager) 03/04/2023 8:03 AM@

## 2023-03-04 ENCOUNTER — Encounter: Payer: Self-pay | Admitting: Internal Medicine

## 2023-03-04 ENCOUNTER — Ambulatory Visit: Payer: 59 | Admitting: Internal Medicine

## 2023-03-04 VITALS — BP 116/75 | HR 81 | Temp 97.2°F | Resp 28 | Ht 62.0 in | Wt 178.0 lb

## 2023-03-04 DIAGNOSIS — K5730 Diverticulosis of large intestine without perforation or abscess without bleeding: Secondary | ICD-10-CM | POA: Diagnosis not present

## 2023-03-04 DIAGNOSIS — D122 Benign neoplasm of ascending colon: Secondary | ICD-10-CM

## 2023-03-04 DIAGNOSIS — Z1211 Encounter for screening for malignant neoplasm of colon: Secondary | ICD-10-CM | POA: Diagnosis present

## 2023-03-04 MED ORDER — SODIUM CHLORIDE 0.9 % IV SOLN: 500.0000 mL | Freq: Once | INTRAVENOUS | Status: DC

## 2023-03-04 NOTE — Progress Notes (Signed)
Sedate, gd SR, tolerated procedure well, VSS, report to RN 

## 2023-03-04 NOTE — Progress Notes (Signed)
Pt's states no medical or surgical changes since previsit or office visit. 

## 2023-03-04 NOTE — Progress Notes (Signed)
Called to room to assist during endoscopic procedure.  Patient ID and intended procedure confirmed with present staff. Received instructions for my participation in the procedure from the performing physician.  

## 2023-03-04 NOTE — Op Note (Signed)
[facility] Patient Name: Rachael [name] Procedure Date: 03/04/2023 7:27 AM MRN: [id number] Endoscopist: Iva Boop , MD, [id number] Age: 63 Referring MD:  Date of Birth: 10-15-1960 Gender: Female Account #: [id number] Procedure:                Colonoscopy Indications:              Screening for colorectal malignant neoplasm, Last                            colonoscopy: 2014 Medicines:                Monitored Anesthesia Care Procedure:                Pre-Anesthesia Assessment:                           - Prior to the procedure, a History and Physical                            was performed, and patient medications and                            allergies were reviewed. The patient's tolerance of                            previous anesthesia was also reviewed. The risks                            and benefits of the procedure and the sedation                            options and risks were discussed with the patient.                            All questions were answered, and informed consent                            was obtained. Prior Anticoagulants: The patient has                            taken no anticoagulant or antiplatelet agents. ASA                            Grade Assessment: II - A patient with mild systemic                            disease. After reviewing the risks and benefits,                            the patient was deemed in satisfactory condition to                            undergo the procedure.  After obtaining informed consent, the colonoscope                            was passed under direct vision. Throughout the                            procedure, the patient's blood pressure, pulse, and                            oxygen saturations were monitored continuously. The                            Olympus Scope SN [id number] was introduced through the                            anus and advanced to the the cecum,  identified by                            appendiceal orifice and ileocecal valve. The                            colonoscopy was performed without difficulty. The                            patient tolerated the procedure well. The quality                            of the bowel preparation was good. The ileocecal                            valve, appendiceal orifice, and rectum were                            photographed. The bowel preparation used was SUPREP                            via split dose instruction. Scope In: 8:09:31 AM Scope Out: 8:26:08 AM Scope Withdrawal Time: 0 hours 14 minutes 23 seconds  Total Procedure Duration: 0 hours 16 minutes 37 seconds  Findings:                 The perianal and digital rectal examinations were                            normal.                           A 1 to 2 mm polyp was found in the proximal                            ascending colon. The polyp was flat. The polyp was                            removed with a cold biopsy forceps. Resection and  retrieval were complete. Verification of patient                            identification for the specimen was done. Estimated                            blood loss was minimal.                           Scattered diverticula were found in the left colon.                           The exam was otherwise without abnormality on                            direct and retroflexion views. Complications:            No immediate complications. Estimated Blood Loss:     Estimated blood loss was minimal. Impression:               - One 1 to 2 mm polyp in the proximal ascending                            colon, removed with a cold biopsy forceps. Resected                            and retrieved.                           - Diverticulosis in the left colon.                           - The examination was otherwise normal on direct                            and retroflexion  views. Recommendation:           - Patient has a contact number available for                            emergencies. The signs and symptoms of potential                            delayed complications were discussed with the                            patient. Return to normal activities tomorrow.                            Written discharge instructions were provided to the                            patient.                           - Resume previous diet.                           -  Continue present medications.                           - Repeat colonoscopy is recommended. The                            colonoscopy date will be determined after pathology                            results from today's exam become available for                            review. Iva Boop, MD 03/04/2023 8:34:51 AM This report has been signed electronically.

## 2023-03-04 NOTE — Patient Instructions (Addendum)
I found and removed one tiny polyp. I will let you know pathology results and when to have another routine colonoscopy by mail and/or My Chart.  You also have a condition called diverticulosis - common and not usually a problem. Please read the handout provided.  I appreciate the opportunity to care for you. Iva Boop, MD, FACG   YOU HAD AN ENDOSCOPIC PROCEDURE TODAY AT THE [facility]:   Refer to the procedure report that was given to you for any specific questions about what was found during the examination.  If the procedure report does not answer your questions, please call your gastroenterologist to clarify.  If you requested that your care partner not be given the details of your procedure findings, then the procedure report has been included in a sealed envelope for you to review at your convenience later.  YOU SHOULD EXPECT: Some feelings of bloating in the abdomen. Passage of more gas than usual.  Walking can help get rid of the air that was put into your GI tract during the procedure and reduce the bloating. If you had a lower endoscopy (such as a colonoscopy or flexible sigmoidoscopy) you may notice spotting of blood in your stool or on the toilet paper. If you underwent a bowel prep for your procedure, you may not have a normal bowel movement for a few days.  Please Note:  You might notice some irritation and congestion in your nose or some drainage.  This is from the oxygen used during your procedure.  There is no need for concern and it should clear up in a day or so.  SYMPTOMS TO REPORT IMMEDIATELY:  Following lower endoscopy (colonoscopy or flexible sigmoidoscopy):  Excessive amounts of blood in the stool  Significant tenderness or worsening of abdominal pains  Swelling of the abdomen that is new, acute  Fever of 100F or higher  For urgent or emergent issues, a gastroenterologist can be reached at any hour by calling [phone]. Do not use MyChart  messaging for urgent concerns.    DIET:  We do recommend a small meal at first, but then you may proceed to your regular diet.  Drink plenty of fluids but you should avoid alcoholic beverages for 24 hours.  ACTIVITY:  You should plan to take it easy for the rest of today and you should NOT DRIVE or use heavy machinery until tomorrow (because of the sedation medicines used during the test).    FOLLOW UP: Our staff will call the number listed on your records the next business day following your procedure.  We will call around 7:15- 8:00 am to check on you and address any questions or concerns that you may have regarding the information given to you following your procedure. If we do not reach you, we will leave a message.     If any biopsies were taken you will be contacted by phone or by letter within the next 1-3 weeks.  Please call us at [phone] if you have not heard about the biopsies in 3 weeks.    SIGNATURES/CONFIDENTIALITY: You and/or your care partner have signed paperwork which will be entered into your electronic medical record.  These signatures attest to the fact that that the information above on your After Visit Summary has been reviewed and is understood.  Full responsibility of the confidentiality of this discharge information lies with you and/or your care-partner.

## 2023-03-05 ENCOUNTER — Telehealth: Payer: Self-pay

## 2023-03-05 NOTE — Telephone Encounter (Signed)
  Follow up Call-     03/04/2023    7:17 AM  Call back number  Post procedure Call Back phone  # [phone]  Permission to leave phone message Yes     Patient questions:  Do you have a fever, pain , or abdominal swelling? No. Pain Score  0 *  Have you tolerated food without any problems? Yes.    Have you been able to return to your normal activities? Yes.    Do you have any questions about your discharge instructions: Diet   No. Medications  No. Follow up visit  No.  Do you have questions or concerns about your Care? No.  Actions: * If pain score is 4 or above: No action needed, pain <4.

## 2023-03-06 ENCOUNTER — Encounter: Payer: Self-pay | Admitting: Internal Medicine

## 2023-03-06 DIAGNOSIS — Z860101 Personal history of adenomatous and serrated colon polyps: Secondary | ICD-10-CM

## 2023-03-06 HISTORY — DX: Personal history of adenomatous and serrated colon polyps: Z86.0101

## 2023-03-06 LAB — SURGICAL PATHOLOGY

## 2023-03-11 ENCOUNTER — Ambulatory Visit (INDEPENDENT_AMBULATORY_CARE_PROVIDER_SITE_OTHER): Payer: 59 | Admitting: Physician Assistant

## 2023-03-11 ENCOUNTER — Encounter (INDEPENDENT_AMBULATORY_CARE_PROVIDER_SITE_OTHER): Payer: Self-pay | Admitting: Physician Assistant

## 2023-03-11 VITALS — BP 117/79 | HR 92 | Temp 98.2°F | Ht 62.0 in | Wt 178.0 lb

## 2023-03-11 DIAGNOSIS — E785 Hyperlipidemia, unspecified: Secondary | ICD-10-CM | POA: Diagnosis not present

## 2023-03-11 DIAGNOSIS — E7849 Other hyperlipidemia: Secondary | ICD-10-CM

## 2023-03-11 DIAGNOSIS — Z6832 Body mass index (BMI) 32.0-32.9, adult: Secondary | ICD-10-CM | POA: Diagnosis not present

## 2023-03-11 DIAGNOSIS — E559 Vitamin D deficiency, unspecified: Secondary | ICD-10-CM

## 2023-03-11 DIAGNOSIS — E669 Obesity, unspecified: Secondary | ICD-10-CM | POA: Diagnosis not present

## 2023-03-11 NOTE — Progress Notes (Signed)
SUBJECTIVE: Discussed the use of AI scribe software for clinical note transcription with the patient, who gave verbal consent to proceed.  Chief Complaint: Obesity  Interim History: she is down 1 lb since her last visit.  The patient is a 63 year old female who presents for follow-up of her obesity treatment plan.  She has been on [facility] for primary indication of hyperlipidemia as well as obesity management, but had a period of discontinuation for several weeks. She restarted Reginal Lutes this past Sunday after receiving insurance approval and completing a colonoscopy. No issues with restarting Wegovy. No constipation, difficulty swallowing, mood changes, or vision changes. She experiences frequent bowel movements after eating and does not feel hungry often as she drinks a lot of liquids, which helps her feel full. No changes in sugar cravings since restarting Wegovy but just restarted this past week.  Her diet includes cottage cheese, eggs, and spinach drinks made with protein shakes to meet her protein goals. She uses a treadmill for exercise but reports that the incline function is not working and plans to have it checked. She is considering changing her job to something less stressful.  She is also on rosuvastatin 10 mg daily for hyperlipidemia management in addition to [facility].  She is also on ergocalciferol 50,000 units every 14 days for vitamin D deficiency and continues to take her vitamin D as prescribed.  She recently underwent a colonoscopy which revealed two benign polyps. There were no precancerous findings, and no further immediate intervention is required.  Rachael [name] is here to discuss her progress with her obesity treatment plan. She is on the Category 1 Plan and states she is following her eating plan approximately 85-90 % of the time. She states she is exercising walking on treadmill 60 minutes 4 times per week.   OBJECTIVE: Visit Diagnoses: Problem List Items Addressed This Visit      Hyperlipidemia - Primary   Vitamin D deficiency   Generalized obesity- Start BMI 33.11   BMI 32.0-32.9,adult  Obesity 64 year old individual on 36 for obesity management. Recently restarted medication without significant side effects. Adheres to a healthy diet and exercise routine, though treadmill incline is non-functional. Emphasized the importance of continuing Wegovy and monitoring for side effects. Patient prefers to maintain current regimen. - Continue Wegovy 0.5 mg weekly and titrate for effect over the next few months.  - Follow-up in 4 weeks on April 07, 2023, at 3 PM - Increase Center dosage if well-tolerated  Hyperlipidemia On rosuvastatin and Wegovy for hyperlipidemia. No new issues reported.  No side effects with Wegovy. Denies mass in neck, dysphagia, dyspepsia, persistent hoarseness, abdominal pain, or N/V/Constipation or diarrhea. Has annual eye exam. Mood is stable.  Discussed benefits of continuing Weogvy and rosuvastatin for cholesterol management and cardiovascular risk reduction. Continue wegovy 0.5 mg weekly for CV risk reduction and titrate for effect over the next few months.  - Continue rosuvastatin 10 mg daily Continue to work on nutrition plan -decreasing simple carbohydrates, increasing lean proteins, decreasing saturated fats and cholesterol , avoiding trans fats and exercise as able to promote weight loss, improve lipids and decrease cardiovascular risks. Plan to recheck lipid panel in 2 months.   Vitamin D Deficiency On ergocalciferol 50,000 units every 14 days for vitamin D deficiency. No new issues reported. Discussed importance of maintaining adequate vitamin D levels for bone health. - Continue ergocalciferol 50,000 units every 14 days - No refill needed this visit.  Low vitamin D levels can be associated with  adiposity and may result in leptin resistance and weight gain. Also associated with fatigue. Currently on vitamin D supplementation without  any adverse effects.  Plan to recheck labs in 2 months.   General Health Maintenance Recent colonoscopy revealed two benign polyps. Labs last done in October 2024. Discussed importance of regular screenings and follow-up. - Repeat labs in a couple of months  Follow-up - Follow-up appointment on April 07, 2023, at 3 PM. Plan to recheck labs in 2 months.   Vitals Temp: 98.2 F (36.8 C) BP: 117/79 Pulse Rate: 92 SpO2: 95 %   Anthropometric Measurements Height: 5\' 2"  (1.575 m) Weight: 178 lb (80.7 kg) BMI (Calculated): 32.55 Weight at Last Visit: 179lb Weight Lost Since Last Visit: 1lb Weight Gained Since Last Visit: 0 Starting Weight: 181lb Total Weight Loss (lbs): 3 lb (1.361 kg)   Body Composition  Body Fat %: 44.3 % Fat Mass (lbs): 78.8 lbs Muscle Mass (lbs): 94.2 lbs Total Body Water (lbs): 68.4 lbs Visceral Fat Rating : 12   Other Clinical Data Fasting: no Labs: no Today's Visit #: 18 Starting Date: 12/27/21     ASSESSMENT AND PLAN:  Diet: Rachael [name] is currently in the action stage of change. As such, her goal is to continue with weight loss efforts. She has agreed to Category 1 Plan.  Exercise: Rachael [name] has been instructed to continue exercising as is for weight loss and overall health benefits.   Behavior Modification:  We discussed the following Behavioral Modification Strategies today: increasing lean protein intake, decreasing simple carbohydrates, increasing vegetables, increase H2O intake, increase high fiber foods, no skipping meals, avoiding temptations, and planning for success. We discussed various medication options to help Rachael [name] with her weight loss efforts and we both agreed to continue Wegovy 0.5 mg weekly for hyperlipidemia and continue to work on nutritional and behavioral strategies to promote weight loss.  .  Return in about 4 weeks (around 04/08/2023).Marland Kitchen She was informed of the importance of frequent follow up visits to maximize her success  with intensive lifestyle modifications for her multiple health conditions.  Attestation Statements:   Reviewed by clinician on day of visit: allergies, medications, problem list, medical history, surgical history, family history, social history, and previous encounter notes.   Time spent on visit including pre-visit chart review and post-visit care and charting was 27 minutes.    Yechezkel Fertig, PA-C

## 2023-04-08 ENCOUNTER — Other Ambulatory Visit (HOSPITAL_BASED_OUTPATIENT_CLINIC_OR_DEPARTMENT_OTHER): Payer: Self-pay

## 2023-04-08 ENCOUNTER — Encounter (INDEPENDENT_AMBULATORY_CARE_PROVIDER_SITE_OTHER): Payer: Self-pay | Admitting: Physician Assistant

## 2023-04-08 ENCOUNTER — Ambulatory Visit (INDEPENDENT_AMBULATORY_CARE_PROVIDER_SITE_OTHER): Payer: 59 | Admitting: Physician Assistant

## 2023-04-08 VITALS — BP 121/75 | HR 84 | Temp 98.9°F | Ht 62.0 in | Wt 181.0 lb

## 2023-04-08 DIAGNOSIS — E669 Obesity, unspecified: Secondary | ICD-10-CM

## 2023-04-08 DIAGNOSIS — E785 Hyperlipidemia, unspecified: Secondary | ICD-10-CM

## 2023-04-08 DIAGNOSIS — Z6833 Body mass index (BMI) 33.0-33.9, adult: Secondary | ICD-10-CM

## 2023-04-08 DIAGNOSIS — E559 Vitamin D deficiency, unspecified: Secondary | ICD-10-CM | POA: Diagnosis not present

## 2023-04-08 DIAGNOSIS — E7849 Other hyperlipidemia: Secondary | ICD-10-CM

## 2023-04-08 MED ORDER — WEGOVY 1 MG/0.5ML ~~LOC~~ SOAJ
1.0000 mg | SUBCUTANEOUS | 0 refills | Status: DC
  Filled 2023-04-08: qty 2, 28d supply, fill #0

## 2023-04-08 NOTE — Progress Notes (Signed)
 SUBJECTIVE: Discussed the use of AI scribe software for clinical note transcription with the patient, who gave verbal consent to proceed.  Chief Complaint: Obesity  Interim History: She is up 3 lbs since last visit.   Rachael [name] is here to discuss her progress with her obesity treatment plan. She is on the Category 1 Plan and states she is following her eating plan approximately 90 % of the time. She states she is exercising treadmill 60 minutes 4 times per week.  Rachael [name] is a 63 year old female who presents for follow-up of her obesity treatment plan.  She is currently on Wegovy 0.5 mg weekly for obesity management. She has not noticed significant changes with the current dosage and still experiences hunger at times, although she does not have cravings. Her eating pattern involves consuming most of her food in the morning and afternoon, with a lighter meal in the evening.  She has a history of hyperlipidemia and is on Crestor 10 mg daily as part of her management plan to reduce the risk of cardiovascular events.  She also has prediabetes and is managing it with lifestyle modifications, including exercise and dietary changes, to prevent progression to diabetes.  She has deficiencies in vitamin D and B12. For her vitamin D deficiency, she takes 50,000 units every 14 days.   She mentioned experiencing a lot of funerals recently, with friends passing away from strokes and we discussed the cardiovascular risk reduction with medications such as Wegovy.   OBJECTIVE: Visit Diagnoses: Problem List Items Addressed This Visit     Hyperlipidemia - Primary   Relevant Medications   Semaglutide-Weight Management (WEGOVY) 1 MG/0.5ML SOAJ   Vitamin D deficiency   Generalized obesity- Start BMI 33.11   Relevant Medications   Semaglutide-Weight Management (WEGOVY) 1 MG/0.5ML SOAJ   Other Visit Diagnoses       BMI 33.0-33.9,adult Current BMI 33.1         Obesity Reports no significant  changes in hunger or cravings on current Wegovy 0.5 mg weekly dose. Discussed intermittent fasting and its potential benefits, including evidence suggesting it can reduce inflammation. Decision made to increase Wegovy dose to 1 mg weekly to enhance weight loss efforts. - Increase Wegovy to 1 mg weekly - Refill prescription and send to [facility] - Encourage continuation of treadmill exercise - Recommend adding strength training exercises with free weights  Hyperlipidemia Currently on Crestor 10 mg daily. Discussed the importance of managing blood pressure and other risk factors for stroke, especially given the prevalence of stroke in the 'stroke belt' region.  Last lipids Lab Results  Component Value Date   CHOL 143 12/27/2021   HDL 50 12/27/2021   LDLCALC 78 12/27/2021   TRIG 79 12/27/2021   Continue to work on nutrition plan -decreasing simple carbohydrates, increasing lean proteins, decreasing saturated fats and cholesterol , avoiding trans fats and exercise as able to promote weight loss, improve lipids and decrease cardiovascular risks. Continue Wegovy for further CV risk reduction.  - Continue Crestor 10 mg daily   Vitamin D Deficiency On Ergocalciferol 50,000 units every 14 days. No new issues reported. No N/V or muscle weakness with Ergocalciferol.  Last vitamin D Lab Results  Component Value Date   VD25OH 75.2 11/18/2022    - Continue ERgocalciferol 50,000 units every 14 days Low vitamin D levels can be associated with adiposity and may result in leptin resistance and weight gain. Also associated with fatigue.  Currently on vitamin D supplementation without  any adverse effects such as nausea, vomiting or muscle weakness.     General Health Maintenance Discussed the prevalence of stroke in the 'stroke belt' and the importance of regular healthcare visits to manage risk factors. Emphasized the importance of diet and healthcare access in stroke prevention. -  Encourage regular healthcare visits   Vitals Temp: 98.9 F (37.2 C) BP: 121/75 Pulse Rate: 84 SpO2: 96 %   Anthropometric Measurements Height: 5\' 2"  (1.575 m) Weight: 181 lb (82.1 kg) BMI (Calculated): 33.1 Weight at Last Visit: 178 lb Weight Lost Since Last Visit: 0 Weight Gained Since Last Visit: 3 lb Starting Weight: 181 lb Total Weight Loss (lbs): 0 lb (0 kg) Peak Weight: 181 lb   Body Composition  Body Fat %: 38.6 % Fat Mass (lbs): 70 lbs Muscle Mass (lbs): 105.6 lbs Total Body Water (lbs): 71.4 lbs Visceral Fat Rating : 11   Other Clinical Data Fasting: no Labs: no Today's Visit #: 19 Starting Date: 12/27/21     ASSESSMENT AND PLAN:  Diet: Rachael [name] is currently in the action stage of change. As such, her goal is to continue with weight loss efforts and has agreed to the Category 1 Plan.   Exercise:  For substantial health benefits, adults should do at least 150 minutes (2 hours and 30 minutes) a week of moderate-intensity, or 75 minutes (1 hour and 15 minutes) a week of vigorous-intensity aerobic physical activity, or an equivalent combination of moderate- and vigorous-intensity aerobic activity. Aerobic activity should be performed in episodes of at least 10 minutes, and preferably, it should be spread throughout the week. and Adults should also include muscle-strengthening activities that involve all major muscle groups on 2 or more days a week.  Behavior Modification:  We discussed the following Behavioral Modification Strategies today: increasing lean protein intake, decreasing simple carbohydrates, increasing vegetables, increase H2O intake, increase high fiber foods, no skipping meals, avoiding temptations, and planning for success. We discussed various medication options to help Rachael [name] with her weight loss efforts and we both agreed to increase Wegovy to 1 mg weekly for primary indication of hyperlipidemia, CV risk reduction and medical weight  loss.  Return in about 4 weeks (around 05/06/2023).Marland Kitchen She was informed of the importance of frequent follow up visits to maximize her success with intensive lifestyle modifications for her multiple health conditions.  Attestation Statements:   Reviewed by clinician on day of visit: allergies, medications, problem list, medical history, surgical history, family history, social history, and previous encounter notes.   Time spent on visit including pre-visit chart review and post-visit care and charting was 26 minutes  Shaheer Bonfield,PA-C    \

## 2023-05-05 NOTE — Progress Notes (Unsigned)
 SUBJECTIVE: Discussed the use of AI scribe software for clinical note transcription with the patient, who gave verbal consent to proceed.  Chief Complaint: Obesity  Interim History: She is down 1 lb since her last visit.   Anaka is here to discuss her progress with her obesity treatment plan. She is on the Category 1 Plan and states she is following her eating plan approximately 95 % of the time. She states she is exercising 60 minutes 4-5 times per week.  Elspeth EMERSEN MASCARI is a 63 year old female who presents for follow-up of her obesity treatment plan.  She has been on [facility] for primary indication of  hyperlipidemia, and has recently resumed this medication after a brief discontinuation. She has lost one pound since her last visit. No side effects such as nausea, vomiting, diarrhea, constipation, sensation of a lump in the throat, difficulty swallowing, vision problems, or mood changes. She was mildly tachycardic at today's visit, but usually HR 70-80's. She was rushing to the visit today. Monitor.   She is currently taking Crestor 10 mg daily, folic acid, and vitamin D 50,000 units every fourteen days. No side effects from these medications have been reported.  She participated in a 5K walk over the weekend, which she does annually, and plans to participate in another one in May. Stress levels at work are very high. Her work situation is described as 'crazy' due to external factors affecting her industry, but she is managing.   Non Fasting labs next OV OBJECTIVE: Visit Diagnoses: Problem List Items Addressed This Visit     Hyperlipidemia - Primary   Relevant Medications   Semaglutide-Weight Management (WEGOVY) 1 MG/0.5ML SOAJ   Vitamin D deficiency   Prediabetes   Generalized obesity- Start BMI 33.11   Relevant Medications   Semaglutide-Weight Management (WEGOVY) 1 MG/0.5ML SOAJ   Other Visit Diagnoses       BMI 33.0-33.9,adult Current BMI 33.0          Obesity She is  being treated with [facility] for primary indication of hyperlipidemia and has lost one pound since her last visit. The current dose is maintained, with consideration for future adjustment. No adverse effects such as nausea, vomiting, diarrhea, or constipation have been reported. - Continue current dose of Wegovy 1mg  weekly.  - Discuss potential dose adjustment at the next visit - Monitor weight and symptoms  Hyperlipidemia She is on Wegovy 1 mg weekly and Crestor 10 mg daily. The effectiveness of the treatment is being monitored with plans to check the lipid panel at a future visit. Denies mass in neck, dysphagia, dyspepsia, persistent hoarseness, abdominal pain, or N/V/Constipation or diarrhea. Has annual eye exam. Mood is stable. Slightly elevated HR at today's visit which is not typical.  No side effects with Crestor Last lipids Lab Results  Component Value Date   CHOL 143 12/27/2021   HDL 50 12/27/2021   LDLCALC 78 12/27/2021   TRIG 79 12/27/2021   Continue/refill Wegovy 1 mg weekly - Continue Crestor 10 mg daily - Check lipid panel at a future visit Continue to work on nutrition plan -decreasing simple carbohydrates, increasing lean proteins, decreasing saturated fats and cholesterol , avoiding trans fats and exercise as able to promote weight loss, improve lipids and decrease cardiovascular risks. Meds ordered this encounter  Medications   Semaglutide-Weight Management (WEGOVY) 1 MG/0.5ML SOAJ    Sig: Inject 1 mg into the skin once a week.    Dispense:  2 mL    Refill:  0    Prediabetes Last A1c was 6.0  Medication(s): Wegovy 1.0 mg SQ weekly Polyphagia:No Lab Results  Component Value Date   HGBA1C 6.0 (H) 11/18/2022   HGBA1C 6.1 (H) 12/27/2021   Lab Results  Component Value Date   INSULIN 12.1 12/27/2021    Plan: Continue and refill Wegovy 1.0 mg SQ weekly for primary indication of hyperlipidemia, but should also improve glycemic control.  Continue working on nutrition  plan to decrease simple carbohydrates, increase lean proteins and exercise to promote weight loss, improve glycemic control and prevent progression to Type 2 diabetes.  Recheck CMET and A1c next visit.   Vitamin D Deficiency Vitamin D is at goal of 50.  Most recent vitamin D level was 75.2. She is on  prescription ergocalciferol 50,000 IU every 14 days. No N/V or muscle weakness with Ergo.  Lab Results  Component Value Date   VD25OH 75.2 11/18/2022   VD25OH 29.8 (L) 12/27/2021    Plan: Continue  prescription ergocalciferol 50,000 IU every 14 days Low vitamin D levels can be associated with adiposity and may result in leptin resistance and weight gain. Also associated with fatigue.  Currently on vitamin D supplementation without any adverse effects such as nausea, vomiting or muscle weakness.  Recheck vitamin D level at next OV.   General Health Maintenance She maintains an active lifestyle, participating in 5K walks, and takes folic acid and vitamin D supplements. Labs will be checked at the next visit to monitor A1c, liver, and kidney function. Nonfasting labs will be conducted, excluding insulin and lipid panel assessment. - Conduct nonfasting labs at the next visit to check A1c, liver, and kidney function - Continue folic acid and vitamin D supplementation - Encourage continued participation in physical activities like 5K walks  Follow-up A follow-up visit is scheduled in four weeks to assess the treatment plan and discuss lab results and potential adjustments. - Follow-up visit in four weeks on April 23rd at 4:00 PM - Discuss lab results and potential treatment adjustments at the next visit Vitals Temp: 98 F (36.7 C) BP: 120/77 Pulse Rate: (!) 112 SpO2: 98 %   Anthropometric Measurements Height: 5\' 2"  (1.575 m) Weight: 180 lb (81.6 kg) BMI (Calculated): 32.91 Weight at Last Visit: 181 lb Weight Lost Since Last Visit: 1 lb Weight Gained Since Last Visit: 0 lb Starting  Weight: 181 lb Total Weight Loss (lbs): 0 lb (0 kg) Peak Weight: 181 lb   Body Composition  Body Fat %: 43.7 % Fat Mass (lbs): 79 lbs Muscle Mass (lbs): 96.4 lbs Total Body Water (lbs): 68.2 lbs Visceral Fat Rating : 12   Other Clinical Data Fasting: no Labs: no Today's Visit #: 20 Starting Date: 12/27/21     ASSESSMENT AND PLAN:  Diet: Makesha is currently in the action stage of change. As such, her goal is to continue with weight loss efforts. She has agreed to Category 2 Plan.  Exercise: Jazzalyn has been instructed to continue exercising as is for weight loss and overall health benefits.   Behavior Modification:  We discussed the following Behavioral Modification Strategies today: increasing lean protein intake, decreasing simple carbohydrates, increasing vegetables, increase H2O intake, increase high fiber foods, no skipping meals, avoiding temptations, and planning for success. We discussed various medication options to help Lun with her weight loss efforts and we both agreed to continue [facility] for primary indication of hyperlipidemia and continue current medications, continue to work on nutritional and behavioral strategies to promote weight loss.  Marland Kitchen  Return in about 4 weeks (around 06/03/2023) for Non fasting labs.. She was informed of the importance of frequent follow up visits to maximize her success with intensive lifestyle modifications for her multiple health conditions.  Attestation Statements:   Reviewed by clinician on day of visit: allergies, medications, problem list, medical history, surgical history, family history, social history, and previous encounter notes.   Time spent on visit including pre-visit chart review and post-visit care and charting was 29 minutes.    Tangia Pinard, PA-C

## 2023-05-06 ENCOUNTER — Encounter (INDEPENDENT_AMBULATORY_CARE_PROVIDER_SITE_OTHER): Payer: Self-pay | Admitting: Physician Assistant

## 2023-05-06 ENCOUNTER — Other Ambulatory Visit (HOSPITAL_BASED_OUTPATIENT_CLINIC_OR_DEPARTMENT_OTHER): Payer: Self-pay

## 2023-05-06 ENCOUNTER — Ambulatory Visit (INDEPENDENT_AMBULATORY_CARE_PROVIDER_SITE_OTHER): Payer: 59 | Admitting: Physician Assistant

## 2023-05-06 VITALS — BP 120/77 | HR 112 | Temp 98.0°F | Ht 62.0 in | Wt 180.0 lb

## 2023-05-06 DIAGNOSIS — Z6833 Body mass index (BMI) 33.0-33.9, adult: Secondary | ICD-10-CM

## 2023-05-06 DIAGNOSIS — R7303 Prediabetes: Secondary | ICD-10-CM

## 2023-05-06 DIAGNOSIS — E559 Vitamin D deficiency, unspecified: Secondary | ICD-10-CM

## 2023-05-06 DIAGNOSIS — Z6832 Body mass index (BMI) 32.0-32.9, adult: Secondary | ICD-10-CM

## 2023-05-06 DIAGNOSIS — E7849 Other hyperlipidemia: Secondary | ICD-10-CM

## 2023-05-06 DIAGNOSIS — E669 Obesity, unspecified: Secondary | ICD-10-CM | POA: Diagnosis not present

## 2023-05-06 DIAGNOSIS — E785 Hyperlipidemia, unspecified: Secondary | ICD-10-CM

## 2023-05-06 MED ORDER — WEGOVY 1 MG/0.5ML ~~LOC~~ SOAJ
1.0000 mg | SUBCUTANEOUS | 0 refills | Status: DC
  Filled 2023-05-06: qty 2, 28d supply, fill #0

## 2023-06-02 ENCOUNTER — Telehealth (INDEPENDENT_AMBULATORY_CARE_PROVIDER_SITE_OTHER): Payer: Self-pay

## 2023-06-02 NOTE — Telephone Encounter (Signed)
 Call to patient to ask her to come in a little early for her appointment.  She needs to have labs drawn and the lab closes at 4 or 4:30 pm. Patient agreed to come in early.  Also made patient aware that she may have to sit for an hour prior to her appointment, because her appointment is at 4 PM.  Patient agreed to that as well.  No further needs, call ended.

## 2023-06-04 ENCOUNTER — Ambulatory Visit (INDEPENDENT_AMBULATORY_CARE_PROVIDER_SITE_OTHER): Admitting: Physician Assistant

## 2023-06-04 ENCOUNTER — Other Ambulatory Visit (HOSPITAL_BASED_OUTPATIENT_CLINIC_OR_DEPARTMENT_OTHER): Payer: Self-pay

## 2023-06-04 ENCOUNTER — Encounter (INDEPENDENT_AMBULATORY_CARE_PROVIDER_SITE_OTHER): Payer: Self-pay | Admitting: Physician Assistant

## 2023-06-04 VITALS — BP 124/82 | HR 99 | Temp 98.3°F | Ht 62.0 in | Wt 181.0 lb

## 2023-06-04 DIAGNOSIS — Z6833 Body mass index (BMI) 33.0-33.9, adult: Secondary | ICD-10-CM

## 2023-06-04 DIAGNOSIS — E559 Vitamin D deficiency, unspecified: Secondary | ICD-10-CM | POA: Diagnosis not present

## 2023-06-04 DIAGNOSIS — E538 Deficiency of other specified B group vitamins: Secondary | ICD-10-CM | POA: Diagnosis not present

## 2023-06-04 DIAGNOSIS — R7303 Prediabetes: Secondary | ICD-10-CM

## 2023-06-04 DIAGNOSIS — E669 Obesity, unspecified: Secondary | ICD-10-CM

## 2023-06-04 DIAGNOSIS — E785 Hyperlipidemia, unspecified: Secondary | ICD-10-CM

## 2023-06-04 MED ORDER — WEGOVY 1.7 MG/0.75ML ~~LOC~~ SOAJ
1.7000 mg | SUBCUTANEOUS | 0 refills | Status: DC
  Filled 2023-06-04: qty 3, 28d supply, fill #0

## 2023-06-04 NOTE — Progress Notes (Unsigned)
 SUBJECTIVE: Discussed the use of AI scribe software for clinical note transcription with the patient, who gave verbal consent to proceed.  Chief Complaint: Obesity  Interim History: She has maintained her weight since last visit.    Rachael [name] is here to discuss her progress with her obesity treatment plan. She is on the Category 1 Plan and states she is following her eating plan approximately 80 % of the time. She states she is exercising on the treadmill 60 minutes 4-5 times per week.  Rachael [name] is a 63 year old female who presents for follow-up of her obesity treatment plan.  She has maintained her weight since the last visit, currently weighing 181 pounds with a BMI of 33.2. She is on a medication regimen of Wegovy  1 mg weekly and is considering increasing the dose to 1.7 mg. She notes fat loss in her face and behind, but not in her arms, stomach, or breasts.   She experiences stress due to her daughter's job instability, which has impacted her sleep. She wakes up between 2 and 3 AM and struggles to return to sleep, often staying awake until meeting friends for prayers at 5 AM. She occasionally uses Benadryl  to aid sleep but finds melatonin ineffective.We discussed trying  to get sunlight exposure in the morning and after work to help reset and relax for sleep at night. She has also tried melatonin without improvement.  She engages in physical activity by walking on a treadmill and using light weights, although walking on hard surfaces causes knee and foot pain. She maintains a routine of exercise to help manage her weight and stress.  No constipation with regular bowel movements. No cravings or hot flashes, and her sleep issues are not due to night sweats.  OBJECTIVE: Visit Diagnoses: Problem List Items Addressed This Visit     Vitamin D  deficiency   Relevant Orders   VITAMIN D  25 Hydroxy (Vit-D Deficiency, Fractures) (Completed)   Prediabetes - Primary   Relevant Medications    Semaglutide -Weight Management (WEGOVY ) 1.7 MG/0.75ML SOAJ   Other Relevant Orders   CMP14+EGFR (Completed)   Hemoglobin A1c (Completed)   Generalized obesity- Start BMI 33.11   Relevant Medications   Semaglutide -Weight Management (WEGOVY ) 1.7 MG/0.75ML SOAJ   Other Relevant Orders   TSH (Completed)   Vitamin B 12 deficiency   Relevant Orders   Vitamin B12 (Completed)   Other Visit Diagnoses       BMI 33.0-33.9,adult Current BMI 33.2         Obesity Obesity with a BMI of 33.2. Weight has been maintained since the last visit. She experiences stress-related weight gain. Current treatment with Wegovy  at 1 mg has been ongoing, with plans to increase the dose to 1.7 mg to enhance weight loss. Reports fat loss in the face and buttocks but not in the breasts and abdomen. No issues with constipation. She is considering Zepbound if insurance covers it. Discussed the importance of avoiding fatty foods and staying hydrated as Wegovy  dose is increased.   Encouraged to continue current exercise regimen, including treadmill walking and light weight lifting, to maintain muscle mass. - Increase Wegovy  dose to 1.7 mg. - Check with insurance about coverage for Zepbound. - Advise to avoid fatty foods and stay hydrated. - Continue current exercise regimen, including treadmill walking and light weight lifting.   Hyperlipidemia LDL is at goal. Medication(s): Wegovy  1 mg weekly.  Denies mass in neck, dysphagia, dyspepsia, persistent hoarseness, abdominal pain, or N/V/Constipation or diarrhea.  Has annual eye exam. Mood is stable.   Cardiovascular risk factors: dyslipidemia, obesity (BMI >= 30 kg/m2), and sedentary lifestyle  Lab Results  Component Value Date   CHOL 143 12/27/2021   HDL 50 12/27/2021   LDLCALC 78 12/27/2021   TRIG 79 12/27/2021   Lab Results  Component Value Date   ALT 26 06/04/2023   AST 20 06/04/2023   ALKPHOS 83 06/04/2023   BILITOT 0.3 06/04/2023   The 10-year ASCVD risk  score (Arnett DK, et al., 2019) is: 4%   Values used to calculate the score:     Age: 56 years     Sex: Female     Is Non-Hispanic African American: Yes     Diabetic: No     Tobacco smoker: No     Systolic Blood Pressure: 124 mmHg     Is BP treated: No     HDL Cholesterol: 50 mg/dL     Total Cholesterol: 143 mg/dL  Plan: Increase Wegovy  to 1.7 mg weekly and monitor.  Continue to work on nutrition plan -decreasing simple carbohydrates, increasing lean proteins, decreasing saturated fats and cholesterol , avoiding trans fats and exercise as able to promote weight loss, improve lipids and decrease cardiovascular risks. Meds ordered this encounter  Medications   Semaglutide -Weight Management (WEGOVY ) 1.7 MG/0.75ML SOAJ    Sig: Inject 1.7 mg into the skin once a week.    Dispense:  3 mL    Refill:  0     Prediabetes She is on Wegovy  for hyperlipidemia and medical weight loss, and should also help with glycemic control  Plan to recheck labs today.  Continue working on nutrition plan to decrease simple carbohydrates, increase lean proteins and exercise to promote weight loss, improve glycemic control and prevent progression to Type 2 diabetes.  Prediabetes is being monitored. Labs are being checked to assess current status, including A1c, vitamin D , B12, TSH, and regular chemistry for liver and kidney function. Results will be sent to Darnelle Elders at [place]. - Order labs including A1c, vitamin D , B12, TSH, and regular chemistry. - Send lab results to Darnelle Elders at [place].  Vitamin D  Deficiency Vitamin D  is not at goal of 50.  Most recent vitamin D  level was 75.2. She is on  prescription ergocalciferol  50,000 IU every 14 days. No N/V or muscle weakness with Ergocalciferol .  Lab Results  Component Value Date   VD25OH 41.7 06/04/2023   VD25OH 75.2 11/18/2022   VD25OH 29.8 (L) 12/27/2021    Plan: Continue  prescription ergocalciferol  50,000 IU every 14 days Recheck vitamin D   level today.  Low vitamin D  levels can be associated with adiposity and may result in leptin resistance and weight gain. Also associated with fatigue.  Currently on vitamin D  supplementation without any adverse effects such as nausea, vomiting or muscle weakness.    Fatigue Likely multifactorial.  Plan : Recheck Vitamin D  and B12, TSH and A1c as well as CMET.   Vitals Temp: 98.3 F (36.8 C) BP: 124/82 Pulse Rate: 99 SpO2: 97 %   Anthropometric Measurements Height: 5\' 2"  (1.575 m) Weight: 181 lb (82.1 kg) BMI (Calculated): 33.1 Weight at Last Visit: 180 lb Weight Lost Since Last Visit: 0 Weight Gained Since Last Visit: 1 lb Starting Weight: 181 lb Total Weight Loss (lbs): 0 lb (0 kg) Peak Weight: 181 lb   Body Composition  Body Fat %: 44.8 % Fat Mass (lbs): 81.2 lbs Muscle Mass (lbs): 95.2 lbs Total Body Water (lbs):  70.2 lbs Visceral Fat Rating : 12   Other Clinical Data Fasting: no Labs: no Today's Visit #: 21 Starting Date: 12/27/21     ASSESSMENT AND PLAN:  Diet: Janay is currently in the action stage of change. As such, her goal is to continue with weight loss efforts. She has agreed to Category 1 Plan.  Exercise: Lajuana has been instructed to work up to a goal of 150 minutes of combined cardio and strengthening exercise per week for weight loss and overall health benefits.   Behavior Modification:  We discussed the following Behavioral Modification Strategies today: increasing lean protein intake, decreasing simple carbohydrates, increasing vegetables, increase H2O intake, increase high fiber foods, no skipping meals, celebration eating strategies, avoiding temptations, and planning for success. We discussed various medication options to help Reid with her weight loss efforts and we both agreed to increase Wegovy  to 1.7 mg weekly for hyperlipidemia and medical weight loss, continue to work on nutritional and behavioral strategies to promote weight loss.   .  Return in about 4 weeks (around 07/02/2023).Aaron Aas She was informed of the importance of frequent follow up visits to maximize her success with intensive lifestyle modifications for her multiple health conditions.  Attestation Statements:   Reviewed by clinician on day of visit: allergies, medications, problem list, medical history, surgical history, family history, social history, and previous encounter notes.   Time spent on visit including pre-visit chart review and post-visit care and charting was 25 minutes.    Robey Massmann, PA-C

## 2023-06-06 LAB — CMP14+EGFR
ALT: 26 IU/L (ref 0–32)
AST: 20 IU/L (ref 0–40)
Albumin: 4.7 g/dL (ref 3.9–4.9)
Alkaline Phosphatase: 83 IU/L (ref 44–121)
BUN/Creatinine Ratio: 17 (ref 12–28)
BUN: 12 mg/dL (ref 8–27)
Bilirubin Total: 0.3 mg/dL (ref 0.0–1.2)
CO2: 20 mmol/L (ref 20–29)
Calcium: 9.7 mg/dL (ref 8.7–10.3)
Chloride: 107 mmol/L — ABNORMAL HIGH (ref 96–106)
Creatinine, Ser: 0.72 mg/dL (ref 0.57–1.00)
Globulin, Total: 2.1 g/dL (ref 1.5–4.5)
Glucose: 89 mg/dL (ref 70–99)
Potassium: 4 mmol/L (ref 3.5–5.2)
Sodium: 143 mmol/L (ref 134–144)
Total Protein: 6.8 g/dL (ref 6.0–8.5)
eGFR: 95 mL/min/{1.73_m2} (ref >59)

## 2023-06-06 LAB — VITAMIN B12: Vitamin B-12: 952 pg/mL (ref 232–1245)

## 2023-06-06 LAB — HEMOGLOBIN A1C
Est. average glucose Bld gHb Est-mCnc: 126 mg/dL
Hgb A1c MFr Bld: 6 % — ABNORMAL HIGH (ref 4.8–5.6)

## 2023-06-06 LAB — TSH: TSH: 1.61 u[IU]/mL (ref 0.450–4.500)

## 2023-06-06 LAB — VITAMIN D 25 HYDROXY (VIT D DEFICIENCY, FRACTURES): Vit D, 25-Hydroxy: 41.7 ng/mL (ref 30.0–100.0)

## 2023-06-11 ENCOUNTER — Telehealth (INDEPENDENT_AMBULATORY_CARE_PROVIDER_SITE_OTHER): Payer: Self-pay

## 2023-06-11 NOTE — Telephone Encounter (Signed)
 Labs have been faxed to [phone].

## 2023-06-29 NOTE — Progress Notes (Signed)
 SUBJECTIVE: Discussed the use of AI scribe software for clinical note transcription with the patient, who gave verbal consent to proceed.  Chief Complaint: Obesity  Interim History: Rachael [name] is down 3 lbs since her last visit.   Rachael [name] is here to discuss her progress with her obesity treatment plan. Rachael [name] is on the Category 1 Plan and states Rachael [name] is following her eating plan approximately 80-85 % of the time. Rachael [name] states Rachael [name] is not exercising 0 minutes 0 times per week.Rachael [name] has been sick with bronchitis  Rachael [name] is a 63 year old female who presents for follow-up of her obesity treatment plan.  Rachael [name] is on a category one obesity treatment plan and has been following it for 80 days, achieving a 5% adherence rate. Her weight has decreased by three pounds since her last visit.  Rachael [name] has been taking 1.7 mg of Wegovy  (semaglutide ) for the past month with no side effects such as nausea, vomiting, constipation, diarrhea, difficulty swallowing, changes in vision, or mood changes. Rachael [name] is satisfied with the increased dose.  Over the past few weeks, Rachael [name] was unable to exercise due to illness with bronchitis, which lasted about two weeks. Rachael [name] feels better since the weekend and was able to resume treadmill exercise today. During her illness, Rachael [name] experienced dizziness when attempting to exercise and had to stop. Rachael [name] maintained hydration and nutrition, particularly focusing on protein intake, despite her illness.  Rachael [name] is also taking vitamin D  supplements without any issues and has recently refilled her prescription.  Pharmacotherapy:Wegovy - started 05/08/22    Wegovy  1.7 mg weekly- started ~06/04/23 OBJECTIVE: Visit Diagnoses: Problem List Items Addressed This Visit     Vitamin D  deficiency   Prediabetes   Relevant Medications   Semaglutide -Weight Management (WEGOVY ) 1.7 MG/0.75ML SOAJ   Generalized obesity- Start BMI 33.11   Relevant Medications   Semaglutide -Weight Management (WEGOVY ) 1.7 MG/0.75ML SOAJ    BMI 32.0-32.9,adult   Other Visit Diagnoses       Bronchitis    -  Primary       Obesity Ongoing obesity management with a weight loss of three pounds since the last visit. Rachael [name] is on 1.7 mg Wegovy , effective with no side effects such as nausea, vomiting, constipation, diarrhea, difficulty swallowing, vision changes, or mood changes. Recent illness limited exercise, but gradual resumption is advised. Muscle mass is maintained, and appetite suppression contributes to weight loss. - Continue 1.7 mg Wegovy . - Encourage gradual resumption of exercise. - Monitor weight and muscle mass. - Continue vitamin D  supplementation. Meds ordered this encounter  Medications   Semaglutide -Weight Management (WEGOVY ) 1.7 MG/0.75ML SOAJ    Sig: Inject 1.7 mg into the skin once a week.    Dispense:  3 mL    Refill:  0    Acute bronchitis Recent episode of acute bronchitis lasting approximately two weeks, with dizziness and inability to exercise. Symptoms have improved, and Rachael [name] can resume treadmill exercise. Hydration was maintained to soothe throat discomfort. - Encourage continued hydration. - Advise gradual return to exercise.   Prediabetes Last A1c was 6.0/insulin  12.1- not at goals  Medication(s): Wegovy  1.7 SQ weekly Denies mass in neck, dysphagia, dyspepsia, persistent hoarseness, abdominal pain, or N/V/Constipation or diarrhea. Has annual eye exam. Mood is stable.   Polyphagia:No Lab Results  Component Value Date   HGBA1C 6.0 (H) 06/04/2023   HGBA1C 6.0 (H) 11/18/2022   HGBA1C 6.1 (H) 12/27/2021   Lab Results  Component Value Date   INSULIN  12.1 12/27/2021  Plan: Continue and refill Wegovy  1.7 SQ weekly Continue working on nutrition plan to decrease simple carbohydrates, increase lean proteins and exercise to promote weight loss, improve glycemic control and prevent progression to Type 2 diabetes.   Vitamin D  Deficiency Vitamin D  is not at goal of 50.  Most recent vitamin D   level was 41.7. Rachael [name] is on  prescription ergocalciferol  50,000 IU every 14 days. Lab Results  Component Value Date   VD25OH 41.7 06/04/2023   VD25OH 75.2 11/18/2022   VD25OH 29.8 (L) 12/27/2021    Plan: Continue  prescription ergocalciferol  50,000 IU every 14 days Low vitamin D  levels can be associated with adiposity and may result in leptin resistance and weight gain. Also associated with fatigue.  Currently on vitamin D  supplementation without any adverse effects such as nausea, vomiting or muscle weakness.   Vitals Temp: 98.6 F (37 C) BP: 118/76 Pulse Rate: (!) 102 SpO2: 96 %   Anthropometric Measurements Height: 5\' 2"  (1.575 m) Weight: 178 lb (80.7 kg) BMI (Calculated): 32.55 Weight at Last Visit: 181 lb Weight Lost Since Last Visit: 3 lb Weight Gained Since Last Visit: 0 Starting Weight: 181 lb Total Weight Loss (lbs): 3 lb (1.361 kg) Peak Weight: 181 lb   Body Composition  Body Fat %: 44.2 % Fat Mass (lbs): 78.8 lbs Muscle Mass (lbs): 94.6 lbs Total Body Water (lbs): 67 lbs Visceral Fat Rating : 12   Other Clinical Data Fasting: No Labs: No Today's Visit #: 22 Starting Date: 12/27/21     ASSESSMENT AND PLAN:  Diet: Rachael [name] is currently in the action stage of change. As such, her goal is to continue with weight loss efforts. Rachael [name] has agreed to Category 1 Plan.  Exercise: Rachael [name] has been instructed to work up to a goal of 150 minutes of combined cardio and strengthening exercise per week for weight loss and overall health benefits.   Behavior Modification:  We discussed the following Behavioral Modification Strategies today: increasing lean protein intake, decreasing simple carbohydrates, increasing vegetables, increase H2O intake, increase high fiber foods, no skipping meals, meal planning and cooking strategies, avoiding temptations, and planning for success. We discussed various medication options to help Rachael [name] with her weight loss efforts and we both  agreed to continue current treatment plan, continue to work on nutritional and behavioral strategies to promote weight loss.  .  Return in about 4 weeks (around 07/28/2023).Rachael [name] Rachael [name] was informed of the importance of frequent follow up visits to maximize her success with intensive lifestyle modifications for her multiple health conditions.  Attestation Statements:   Reviewed by clinician on day of visit: allergies, medications, problem list, medical history, surgical history, family history, social history, and previous encounter notes.   Time spent on visit including pre-visit chart review and post-visit care and charting was 20 minutes.    Rachael Ashworth, PA-C

## 2023-06-30 ENCOUNTER — Encounter (INDEPENDENT_AMBULATORY_CARE_PROVIDER_SITE_OTHER): Payer: Self-pay | Admitting: Physician Assistant

## 2023-06-30 ENCOUNTER — Other Ambulatory Visit (HOSPITAL_BASED_OUTPATIENT_CLINIC_OR_DEPARTMENT_OTHER): Payer: Self-pay

## 2023-06-30 ENCOUNTER — Ambulatory Visit (INDEPENDENT_AMBULATORY_CARE_PROVIDER_SITE_OTHER): Admitting: Physician Assistant

## 2023-06-30 VITALS — BP 118/76 | HR 102 | Temp 98.6°F | Ht 62.0 in | Wt 178.0 lb

## 2023-06-30 DIAGNOSIS — E538 Deficiency of other specified B group vitamins: Secondary | ICD-10-CM

## 2023-06-30 DIAGNOSIS — J40 Bronchitis, not specified as acute or chronic: Secondary | ICD-10-CM | POA: Diagnosis not present

## 2023-06-30 DIAGNOSIS — R7303 Prediabetes: Secondary | ICD-10-CM

## 2023-06-30 DIAGNOSIS — E669 Obesity, unspecified: Secondary | ICD-10-CM | POA: Diagnosis not present

## 2023-06-30 DIAGNOSIS — Z6832 Body mass index (BMI) 32.0-32.9, adult: Secondary | ICD-10-CM

## 2023-06-30 DIAGNOSIS — E559 Vitamin D deficiency, unspecified: Secondary | ICD-10-CM

## 2023-06-30 MED ORDER — WEGOVY 1.7 MG/0.75ML ~~LOC~~ SOAJ
1.7000 mg | SUBCUTANEOUS | 0 refills | Status: DC
  Filled 2023-06-30: qty 3, 28d supply, fill #0

## 2023-07-28 NOTE — Progress Notes (Signed)
 SUBJECTIVE: Discussed the use of AI scribe software for clinical note transcription with the patient, who gave verbal consent to proceed.  Chief Complaint: Obesity  Interim History: She is down 2 lbs since last visit.  Muscle mass + 8.6 lbs Adipose mass - 11.2 lbs  Rachael [name] is here to discuss her progress with her obesity treatment plan. She is on the Category 1 Plan and states she is following her eating plan approximately 95 % of the time. She states she is walking for exercise 60 minutes 5 times per week.  Rachael [name] is a 63 year old female who presents for follow-up of her obesity treatment plan.  She is currently on Wegovy  1.7 mg weekly and has lost two pounds since her last visit. She adheres to a category one plan approximately 95% of the time and engages in physical activity by walking on the treadmill for 60 minutes five days per week. No excessive cravings are reported, and she is focused on maintaining a high protein diet, including grilled meats and chicken.  She manages hypercholesterolemia with rosuvastatin 10 mg daily and vitamin D  deficiency with Ergocalciferol  50,000 units every 14 days. No issues with the vitamin D  supplementation are reported. Her A1c is currently at 6, indicating prediabetes.  In terms of her social history, she reports handling work stress as 'tolerable' and mentions that her daughter is doing well, which reduces her stress levels.  She ensures adequate hydration by starting her day with water and feels she is consuming enough protein. She uses monk fruit as a sweetener in her tea, which she drinks three cups of in the morning, and reports no gastrointestinal discomfort from it.  No constipation, nausea, vomiting, diarrhea, changes in vision, difficulty swallowing, or feeling of a lump in her throat. No issues with her current medication regimen.  OBJECTIVE: Visit Diagnoses: Problem List Items Addressed This Visit     Hyperlipidemia   Vitamin D   deficiency   Relevant Medications   Vitamin D , Ergocalciferol , (DRISDOL ) 1.25 MG (50000 UNIT) CAPS capsule   Prediabetes - Primary   Relevant Medications   Semaglutide -Weight Management (WEGOVY ) 1.7 MG/0.75ML SOAJ   Generalized obesity- Start BMI 33.11   Relevant Medications   Semaglutide -Weight Management (WEGOVY ) 1.7 MG/0.75ML SOAJ   BMI 32.0-32.9,adult  Obesity She is on a treatment plan for obesity, currently taking Wegovy  1.7 mg weekly. She has lost two pounds since her last visit and is following her dietary plan 95% of the time. She engages in regular physical activity, walking on the treadmill for 60 minutes five days per week. Her visceral adipose rating has improved to 10, surpassing the goal of 12. Emphasized the importance of protein intake and hydration. Discussed the potential benefits of adding strength training to her routine to promote further weight loss and muscle gain. Strength training could be done through the [facility] program at [facility] or the [place] program at [place] [facility], both offering structured guidance and support. - Continue Wegovy  1.7 mg weekly - Encourage continued adherence to dietary plan - Encourage regular physical activity, including treadmill walking - Discuss potential addition of strength training, such as the Right Start program at [employer] or the [place] program at [place] [facility] - Refill Wegovy  prescription Meds ordered this encounter  Medications   Semaglutide -Weight Management (WEGOVY ) 1.7 MG/0.75ML SOAJ    Sig: Inject 1.7 mg into the skin once a week.    Dispense:  3 mL    Refill:  1   Vitamin D , Ergocalciferol , (DRISDOL ) 1.25 MG (50000 UNIT) CAPS capsule    Sig: Take 1 capsule (50,000 Units total) by mouth every 14 (fourteen) days.    Dispense:  12 capsule    Refill:  0    Prediabetes Her A1c is currently at 6.0, indicating prediabetes. Emphasized the importance of maintaining this level and  preventing progression to diabetes. The use of Wegovy  is expected to aid in managing her blood glucose levels. Lab Results  Component Value Date   HGBA1C 6.0 (H) 06/04/2023   HGBA1C 6.0 (H) 11/18/2022   HGBA1C 6.1 (H) 12/27/2021   Lab Results  Component Value Date   LDLCALC 78 12/27/2021   CREATININE 0.72 06/04/2023   INSULIN   Date Value Ref Range Status  12/27/2021 12.1 2.6 - 24.9 uIU/mL Final  ]Continue working on nutrition plan to decrease simple carbohydrates, increase lean proteins and exercise to promote weight loss, improve glycemic control and prevent progression to Type 2 diabetes.  - Monitor A1c levels - Continue Wegovy  as it may help with blood glucose management  Hypercholesterolemia She is on rosuvastatin 10 mg daily for hypercholesterolemia.  No SE with current medication.  Last lipids Lab Results  Component Value Date   CHOL 143 12/27/2021   HDL 50 12/27/2021   LDLCALC 78 12/27/2021   TRIG 79 12/27/2021   Continue to work on nutrition plan -decreasing simple carbohydrates, increasing lean proteins, decreasing saturated fats and cholesterol , avoiding trans fats and exercise as able to promote weight loss, improve lipids and decrease cardiovascular risks. - Continue rosuvastatin 10 mg daily  Vitamin D  Deficiency She is taking Ergocalciferol  50,000 units every 14 days for vitamin D  deficiency. Her vitamin D  levels are currently stable, and she reports no adverse effects from the supplementation. Last vitamin D  Lab Results  Component Value Date   VD25OH 41.7 06/04/2023   Low vitamin D  levels can be associated with adiposity and may result in leptin resistance and weight gain. Also associated with fatigue.  Currently on vitamin D  supplementation without any adverse effects such as nausea, vomiting or muscle weakness.  - Continue Rosanna Comment Ergocalciferol  50,000 units every 14 days.  Meds ordered this encounter  Medications   Semaglutide -Weight Management (WEGOVY )  1.7 MG/0.75ML SOAJ    Sig: Inject 1.7 mg into the skin once a week.    Dispense:  3 mL    Refill:  1   Vitamin D , Ergocalciferol , (DRISDOL ) 1.25 MG (50000 UNIT) CAPS capsule    Sig: Take 1 capsule (50,000 Units total) by mouth every 14 (fourteen) days.    Dispense:  12 capsule    Refill:  0    General Health Maintenance Discussed the use of monk fruit as a safe, calorie-free sweetener for diabetes, which does not contribute to caloric intake or increase blood sugar levels. Monk fruit is a natural sweetener that is safe for diabetes and does not cause gastrointestinal discomfort at her current intake level. - Encourage continued use of monk fruit as a sweetener - Promote hydration and adequate protein intake  Vitals Temp: 98.6 F (37 C) BP: 116/81 Pulse Rate: (!) 101 SpO2: 98 %   Anthropometric Measurements Height: 5' 2 (1.575 m) Weight: 176 lb (79.8 kg) BMI (Calculated): 32.18 Weight at Last Visit: 178 lb Weight Lost Since Last Visit: 2 lb Weight Gained Since Last Visit: 0 Starting Weight: 181 lb Total Weight Loss (lbs): 5 lb (2.268 kg) Peak Weight: 181 lb   Body Composition  Body Fat %:  38.2 % Fat Mass (lbs): 67.2 lbs Muscle Mass (lbs): 103.2 lbs Total Body Water (lbs): 68.6 lbs Visceral Fat Rating : 10   Other Clinical Data Fasting: No Labs: No Today's Visit #: 23 Starting Date: 12/27/21     ASSESSMENT AND PLAN:  Diet: Devona is currently in the action stage of change. As such, her goal is to continue with weight loss efforts. She has agreed to Category 1 Plan.  Exercise: Lovene has been instructed to work up to a goal of 150 minutes of combined cardio and strengthening exercise per week and to continue exercising as is for weight loss and overall health benefits.   Behavior Modification:  We discussed the following Behavioral Modification Strategies today: increasing lean protein intake, decreasing simple carbohydrates, increasing vegetables, increase  H2O intake, increase high fiber foods, no skipping meals, meal planning and cooking strategies, avoiding temptations, and planning for success. We discussed various medication options to help Ame with her weight loss efforts and we both agreed to continue current treatment plan, continue to work on nutritional and behavioral strategies to promote weight loss.  .  Return in about 4 weeks (around 08/26/2023).Aaron Aas She was informed of the importance of frequent follow up visits to maximize her success with intensive lifestyle modifications for her multiple health conditions.  Attestation Statements:   Reviewed by clinician on day of visit: allergies, medications, problem list, medical history, surgical history, family history, social history, and previous encounter notes.   Time spent on visit including pre-visit chart review and post-visit care and charting was 22 minutes.    Zyden Suman, PA-C

## 2023-07-29 ENCOUNTER — Other Ambulatory Visit (HOSPITAL_BASED_OUTPATIENT_CLINIC_OR_DEPARTMENT_OTHER): Payer: Self-pay

## 2023-07-29 ENCOUNTER — Ambulatory Visit (INDEPENDENT_AMBULATORY_CARE_PROVIDER_SITE_OTHER): Admitting: Physician Assistant

## 2023-07-29 ENCOUNTER — Encounter (INDEPENDENT_AMBULATORY_CARE_PROVIDER_SITE_OTHER): Payer: Self-pay | Admitting: Physician Assistant

## 2023-07-29 VITALS — BP 116/81 | HR 101 | Temp 98.6°F | Ht 62.0 in | Wt 176.0 lb

## 2023-07-29 DIAGNOSIS — E559 Vitamin D deficiency, unspecified: Secondary | ICD-10-CM

## 2023-07-29 DIAGNOSIS — Z6832 Body mass index (BMI) 32.0-32.9, adult: Secondary | ICD-10-CM

## 2023-07-29 DIAGNOSIS — R7303 Prediabetes: Secondary | ICD-10-CM

## 2023-07-29 DIAGNOSIS — E7849 Other hyperlipidemia: Secondary | ICD-10-CM

## 2023-07-29 DIAGNOSIS — E669 Obesity, unspecified: Secondary | ICD-10-CM | POA: Diagnosis not present

## 2023-07-29 DIAGNOSIS — E785 Hyperlipidemia, unspecified: Secondary | ICD-10-CM | POA: Diagnosis not present

## 2023-07-29 DIAGNOSIS — Z860101 Personal history of adenomatous and serrated colon polyps: Secondary | ICD-10-CM

## 2023-07-29 MED ORDER — WEGOVY 1.7 MG/0.75ML ~~LOC~~ SOAJ
1.7000 mg | SUBCUTANEOUS | 1 refills | Status: DC
  Filled 2023-07-29: qty 3, 28d supply, fill #0

## 2023-07-29 MED ORDER — VITAMIN D (ERGOCALCIFEROL) 1.25 MG (50000 UNIT) PO CAPS
50000.0000 [IU] | ORAL_CAPSULE | ORAL | 0 refills | Status: DC
  Filled 2023-07-29 – 2023-09-22 (×2): qty 2, 28d supply, fill #0

## 2023-08-25 NOTE — Progress Notes (Unsigned)
 SUBJECTIVE: Discussed the use of AI scribe software for clinical note transcription with the patient, who gave verbal consent to proceed.  Chief Complaint: Obesity  Interim History: She is down 4 lbs since last visit.  Down 9 lbs overall TBW loss of ~ 5%  Rachael [name] is here to discuss her progress with her obesity treatment plan. She is on the Category 1 Plan and states she is following her eating plan approximately 95 % of the time. She states she is exercising 120 minutes 4-5 times per week.  Rachael [name] is a 63 year old female who presents for follow-up of her obesity treatment plan.  She has lost four pounds since her last visit and adheres to a category one plan 95% of the time. Her physical activity includes attending [facility] for 60 minutes three times per week and using the treadmill for 60 minutes four to five days per week. Her body adipose percentage has decreased from 45.7% initially to 38.1%.  She is on Wegovy  1.7 mg weekly for hypercholesterolemia, hyperlipidemia, prediabetes, and medical weight loss. No chest pain, shortness of breath, nausea, vomiting, difficulty swallowing, or changes in mood or vision. She denies caffeine intake. She uses a watch to monitor her heart rate during exercise.  She experiences sleeping difficulties and often takes Benadryl  to aid sleep, as melatonin is ineffective. No daytime sleepiness. Her diet includes adequate protein intake from fish, eggs, cottage cheese, and other cheeses. No constipation, diarrhea, or cravings. She is socially active, attending [employer] with coworkers, which provides motivation.   Upcoming-She is planning a cruise to the [place] and intends to maintain her exercise routine during the trip. OBJECTIVE: Visit Diagnoses: Problem List Items Addressed This Visit     Hyperlipidemia   Relevant Medications   Semaglutide -Weight Management (WEGOVY ) 1.7 MG/0.75ML SOAJ   Vitamin D  deficiency   Prediabetes - Primary   Relevant  Medications   Semaglutide -Weight Management (WEGOVY ) 1.7 MG/0.75ML SOAJ   Generalized obesity- Start BMI 33.11   Relevant Medications   Semaglutide -Weight Management (WEGOVY ) 1.7 MG/0.75ML SOAJ  Obesity Rachael [name] is being managed for obesity. She has lost four pounds since her last visit and adheres to a category one plan 95% of the time. She engages in regular physical activity, attending [facility] for 60 minutes three times per week, and using the treadmill for 60 minutes four to five days per week. Her body adipose percentage has decreased from 45.7% to 38.1%, with a goal of 35% or less. She is on Wegovy  1.7 mg weekly for medical weight loss, hypercholesterolemia, hyperlipidemia, and prediabetes. The anticipated outcome is further reduction in body adipose percentage and maintenance of lean muscle mass. - Continue/refill Wegovy  1.7 mg weekly - Maintain current exercise regimen - Continue dietary plan with focus on protein intake Meds ordered this encounter  Medications   Semaglutide -Weight Management (WEGOVY ) 1.7 MG/0.75ML SOAJ    Sig: Inject 1.7 mg into the skin once a week.    Dispense:  3 mL    Refill:  1   Hyperlipidemia LDL is at goal. Medication(s): Crestor 10 mg daily and Wegovy  1.7 mg weekly. No SE with crestor. Denies mass in neck, dysphagia, dyspepsia, persistent hoarseness, abdominal pain, or N/V/Constipation or diarrhea. Has annual eye exam. Mood is stable.   Cardiovascular risk factors: dyslipidemia and obesity (BMI >= 30 kg/m2)  Lab Results  Component Value Date   CHOL 143 12/27/2021   HDL 50 12/27/2021   LDLCALC 78 12/27/2021   TRIG 79 12/27/2021  Lab Results  Component Value Date   ALT 26 06/04/2023   AST 20 06/04/2023   ALKPHOS 83 06/04/2023   BILITOT 0.3 06/04/2023   The 10-year ASCVD risk score (Arnett DK, et al., 2019) is: 3.5%   Values used to calculate the score:     Age: 85 years     Clincally relevant sex: Female     Is Non-Hispanic African  American: Yes     Diabetic: No     Tobacco smoker: No     Systolic Blood Pressure: 118 mmHg     Is BP treated: No     HDL Cholesterol: 50 mg/dL     Total Cholesterol: 143 mg/dL  Plan: Continue Crestor 10 mg daily and Wegovy .1.7 mg weekly Continue to work on nutrition plan -decreasing simple carbohydrates, increasing lean proteins, decreasing saturated fats and cholesterol , avoiding trans fats and exercise as able to promote weight loss, improve lipids and decrease cardiovascular risks.  Elevated Heart Rate Jonte experiences elevated heart rates during recent visits. She denies caffeine intake and has no thyroid issues. Stays well hydrated. The elevated heart rate may be related to her tendency for constant movement vs.  possibly a side effect of Wegovy . No chest pain or shortness of breath reported or other concerns signs or symptoms. Previous thyroid function tests from April were normal. Monitoring heart rate and employing relaxation techniques are suggested. - Monitor heart rate using a watch during exercise - Encourage relaxation techniques to reduce heart rate  Sleep Disturbance Rachael [name] reports difficulty falling asleep and uses Benadryl  to aid sleep. She has tried melatonin without success. Long-term use of Benadryl  is discouraged due to potential links to dementia. She does not feel sleepy during the day and denies significant stress or anxiety affecting sleep. The focus is on minimizing Benadryl  use and exploring alternative sleep aids or relaxation techniques. - Minimize use of Benadryl  for sleep  General Health Maintenance Rachael [name] maintains a healthy lifestyle with regular exercise and a balanced diet, ensuring adequate protein intake through fish, eggs, and cottage cheese. No gastrointestinal issues reported with Wegovy . She plans to stay active and maintain dietary habits during an upcoming cruise. - Encourage continued healthy lifestyle and dietary habits - Stay active during  upcoming cruise - Focus on protein intake during travel  Follow-up [name] is scheduled for follow-up appointments to monitor her progress and adjust her treatment plan as necessary. - Schedule follow-up appointment on August 13th at 4 PM - Schedule subsequent follow-up in September on a Wednesday at 3:30 PM  Vitals Temp: 98.1 F (36.7 C) BP: 118/79 Pulse Rate: (!) 112 SpO2: 94 %   Anthropometric Measurements Height: 5' 2 (1.575 m) Weight: 172 lb (78 kg) BMI (Calculated): 31.45 Weight at Last Visit: 176 lb Weight Lost Since Last Visit: 4 lb Weight Gained Since Last Visit: 0 Starting Weight: 181 lb Total Weight Loss (lbs): 9 lb (4.082 kg) Peak Weight: 181 lb   Body Composition  Body Fat %: 38.1 % Fat Mass (lbs): 65.6 lbs Muscle Mass (lbs): 101 lbs Total Body Water (lbs): 68.2 lbs Visceral Fat Rating : 10   Other Clinical Data Fasting: No Labs: No Today's Visit #: 24 Starting Date: 12/27/21     ASSESSMENT AND PLAN:  Diet: Mylah is currently in the action stage of change. As such, her goal is to continue with weight loss efforts. She has agreed to Category 1 Plan.  Exercise: Deola has been instructed to continue exercising as is for weight  loss and overall health benefits.   Behavior Modification:  We discussed the following Behavioral Modification Strategies today: increasing lean protein intake, decreasing simple carbohydrates, increasing vegetables, increase H2O intake, increase high fiber foods, no skipping meals, meal planning and cooking strategies, avoiding temptations, and planning for success. We discussed various medication options to help Jani with her weight loss efforts and we both agreed to continue current treatment plan.  Return in about 4 weeks (around 09/23/2023).[name] She was informed of the importance of frequent follow up visits to maximize her success with intensive lifestyle modifications for her multiple health conditions.  Attestation  Statements:   Reviewed by clinician on day of visit: allergies, medications, problem list, medical history, surgical history, family history, social history, and previous encounter notes.   Time spent on visit including pre-visit chart review and post-visit care and charting was 25 minutes.    Teala Daffron, PA-C

## 2023-08-26 ENCOUNTER — Other Ambulatory Visit (HOSPITAL_BASED_OUTPATIENT_CLINIC_OR_DEPARTMENT_OTHER): Payer: Self-pay

## 2023-08-26 ENCOUNTER — Ambulatory Visit (INDEPENDENT_AMBULATORY_CARE_PROVIDER_SITE_OTHER): Admitting: Physician Assistant

## 2023-08-26 ENCOUNTER — Encounter (INDEPENDENT_AMBULATORY_CARE_PROVIDER_SITE_OTHER): Payer: Self-pay | Admitting: Physician Assistant

## 2023-08-26 VITALS — BP 118/79 | HR 112 | Temp 98.1°F | Ht 62.0 in | Wt 172.0 lb

## 2023-08-26 DIAGNOSIS — G479 Sleep disorder, unspecified: Secondary | ICD-10-CM | POA: Diagnosis not present

## 2023-08-26 DIAGNOSIS — E559 Vitamin D deficiency, unspecified: Secondary | ICD-10-CM

## 2023-08-26 DIAGNOSIS — E785 Hyperlipidemia, unspecified: Secondary | ICD-10-CM | POA: Diagnosis not present

## 2023-08-26 DIAGNOSIS — R7303 Prediabetes: Secondary | ICD-10-CM

## 2023-08-26 DIAGNOSIS — R000 Tachycardia, unspecified: Secondary | ICD-10-CM | POA: Diagnosis not present

## 2023-08-26 DIAGNOSIS — E669 Obesity, unspecified: Secondary | ICD-10-CM

## 2023-08-26 DIAGNOSIS — E7849 Other hyperlipidemia: Secondary | ICD-10-CM

## 2023-08-26 DIAGNOSIS — Z6831 Body mass index (BMI) 31.0-31.9, adult: Secondary | ICD-10-CM

## 2023-08-26 MED ORDER — WEGOVY 1.7 MG/0.75ML ~~LOC~~ SOAJ
1.7000 mg | SUBCUTANEOUS | 1 refills | Status: DC
  Filled 2023-08-26: qty 3, 28d supply, fill #0

## 2023-09-20 ENCOUNTER — Other Ambulatory Visit (INDEPENDENT_AMBULATORY_CARE_PROVIDER_SITE_OTHER): Payer: Self-pay | Admitting: Physician Assistant

## 2023-09-20 DIAGNOSIS — E559 Vitamin D deficiency, unspecified: Secondary | ICD-10-CM

## 2023-09-22 ENCOUNTER — Other Ambulatory Visit (HOSPITAL_BASED_OUTPATIENT_CLINIC_OR_DEPARTMENT_OTHER): Payer: Self-pay

## 2023-09-22 ENCOUNTER — Other Ambulatory Visit: Payer: Self-pay

## 2023-09-23 ENCOUNTER — Other Ambulatory Visit (HOSPITAL_BASED_OUTPATIENT_CLINIC_OR_DEPARTMENT_OTHER): Payer: Self-pay

## 2023-09-23 ENCOUNTER — Ambulatory Visit (INDEPENDENT_AMBULATORY_CARE_PROVIDER_SITE_OTHER): Admitting: Nurse Practitioner

## 2023-09-23 ENCOUNTER — Encounter (INDEPENDENT_AMBULATORY_CARE_PROVIDER_SITE_OTHER): Payer: Self-pay | Admitting: Nurse Practitioner

## 2023-09-23 VITALS — BP 125/78 | HR 90 | Temp 98.8°F | Ht 62.0 in | Wt 173.0 lb

## 2023-09-23 DIAGNOSIS — R7303 Prediabetes: Secondary | ICD-10-CM | POA: Diagnosis not present

## 2023-09-23 DIAGNOSIS — E7849 Other hyperlipidemia: Secondary | ICD-10-CM | POA: Diagnosis not present

## 2023-09-23 DIAGNOSIS — E66811 Obesity, class 1: Secondary | ICD-10-CM

## 2023-09-23 DIAGNOSIS — E669 Obesity, unspecified: Secondary | ICD-10-CM

## 2023-09-23 DIAGNOSIS — Z6831 Body mass index (BMI) 31.0-31.9, adult: Secondary | ICD-10-CM

## 2023-09-23 DIAGNOSIS — E559 Vitamin D deficiency, unspecified: Secondary | ICD-10-CM

## 2023-09-23 MED ORDER — WEGOVY 1.7 MG/0.75ML ~~LOC~~ SOAJ
1.7000 mg | SUBCUTANEOUS | 0 refills | Status: DC
  Filled 2023-09-23: qty 3, 28d supply, fill #0

## 2023-09-23 NOTE — Progress Notes (Addendum)
 Office: [phone]  /  Fax: [phone]  WEIGHT SUMMARY AND BIOMETRICS  Weight Lost Since Last Visit: 0  Weight Gained Since Last Visit: 1 lb   Vitals Temp: 98.8 F (37.1 C) BP: 125/78 Pulse Rate: 90 SpO2: 98 %   Anthropometric Measurements Height: 5' 2 (1.575 m) Weight: 173 lb (78.5 kg) BMI (Calculated): 31.63 Weight at Last Visit: 172 lb Weight Lost Since Last Visit: 0 Weight Gained Since Last Visit: 1 lb Starting Weight: 181 lb Total Weight Loss (lbs): 8 lb (3.629 kg) Peak Weight: 181 lb   Body Composition  Body Fat %: 42.5 % Fat Mass (lbs): 73.8 lbs Muscle Mass (lbs): 94.8 lbs Total Body Water (lbs): 67.2 lbs Visceral Fat Rating : 11   Other Clinical Data Fasting: No Labs: No Today's Visit #: 25 Starting Date: 12/27/21    Starting weight: 181 Today's weight: 173 Total weight loss: 8 pounds PBW: 4.4 %   HPI  Chief Complaint: OBESITY  Rachael [name] is here to discuss her progress with her obesity treatment plan. She is on the the Category 1 Plan and states she is following her eating plan approximately 95 % of the time. She states she is exercising 60 minutes 4-5 days per week.   Interval History:  Since last office visit she was on a 8 day cruise- [place], [place], [place], [place]. She did take Wegovy  before she left and had no side effects while on the cruise.  She does believe her current dose of 1.7 mg QW is working well. [name] She was exercising on the boat, using the exercise machines and doing more walking. She is planning to take swimming lessons at [facility]. She does to [facility] 3 days a week doing the right track program for 9 weeks. She is limiting saturated fats and simple carbs in her diet. She did eat off plan on cruise but has returned to plan once she returned.  She does have Vit D deficiency and is on Ergocalciferol  50000 units once every 14 days.  She continues to take Crestor 10 mg for hyperlipidemia. She is continuing to limit saturated  fats in her diet. Denies side effects with medication.  She needs to use 5 weeks of vacation before end of year but has no other vacations currently planned.   Bioimpedence today does not appear to be accurate. It is noting 6 pound loss of muscle and 8 pound gain in fat mass.  Advised will consider this an outlier and reevaluate at next visit.  Pharmacotherapy for weight loss: She is currently taking Wegovy  1.7 mg SQ QW for medical weight loss.  Denies side effects.      PHYSICAL EXAM:  Blood pressure 125/78, pulse 90, temperature 98.8 F (37.1 C), height 5' 2 (1.575 m), weight 173 lb (78.5 kg), last menstrual period 09/22/2012, SpO2 98%. Body mass index is 31.64 kg/m.  General: She is overweight, cooperative, alert, well developed, and in no acute distress. PSYCH: Has normal mood, affect and thought process.   Extremities: No edema.  Neurologic: No gross sensory or motor deficits. No tremors or fasciculations noted.    DIAGNOSTIC DATA REVIEWED:  BMET    Component Value Date/Time   NA 143 06/04/2023 1633   K 4.0 06/04/2023 1633   CL 107 (H) 06/04/2023 1633   CO2 20 06/04/2023 1633   GLUCOSE 89 06/04/2023 1633   BUN 12 06/04/2023 1633   CREATININE 0.72 06/04/2023 1633   CALCIUM 9.7 06/04/2023 1633   Lab Results  Component Value  Date   HGBA1C 6.0 (H) 06/04/2023   HGBA1C 6.1 (H) 12/27/2021   Lab Results  Component Value Date   INSULIN  12.1 12/27/2021   Lab Results  Component Value Date   TSH 1.610 06/04/2023   CBC    Component Value Date/Time   WBC 6.4 11/18/2022 1551   RBC 4.34 11/18/2022 1551   HGB 12.2 11/18/2022 1551   HCT 39.0 11/18/2022 1551   PLT 256 11/18/2022 1551   MCV 90 11/18/2022 1551   MCH 28.1 11/18/2022 1551   MCHC 31.3 (L) 11/18/2022 1551   RDW 13.5 11/18/2022 1551   Iron Studies No results found for: IRON, TIBC, FERRITIN, IRONPCTSAT Lipid Panel     Component Value Date/Time   CHOL 143 12/27/2021 0920   TRIG 79 12/27/2021 0920    HDL 50 12/27/2021 0920   LDLCALC 78 12/27/2021 0920   Hepatic Function Panel     Component Value Date/Time   PROT 6.8 06/04/2023 1633   ALBUMIN 4.7 06/04/2023 1633   AST 20 06/04/2023 1633   ALT 26 06/04/2023 1633   ALKPHOS 83 06/04/2023 1633   BILITOT 0.3 06/04/2023 1633      Component Value Date/Time   TSH 1.610 06/04/2023 1633   Nutritional Lab Results  Component Value Date   VD25OH 41.7 06/04/2023   VD25OH 75.2 11/18/2022   VD25OH 29.8 (L) 12/27/2021     ASSESSMENT AND PLAN  TREATMENT PLAN FOR OBESITY:  Recommended Dietary Goals  Mesa is currently in the action stage of change. As such, her goal is to continue weight management plan. She has agreed to the Category 1 Plan.  Behavioral Intervention  We discussed the following Behavioral Modification Strategies today: continue to work on maintaining a reduced calorie state, getting the recommended amount of protein, incorporating whole foods, making healthy choices, staying well hydrated and practicing mindfulness when eating. and getting back on plan after cruise.   Recommended Physical Activity Goals  Sherrica has been advised to work up to 150 minutes of moderate intensity aerobic activity a week and strengthening exercises 2-3 times per week for cardiovascular health, weight loss maintenance and preservation of muscle mass.   She has agreed to Continue current level of physical activity . She is also planning to start swimming lessons at [facility], she never learned how to swim.   Pharmacotherapy We discussed various medication options to help Oriel with her weight loss efforts and we both agreed to continue Wegovy  1.7 mg SQ QW. Will also continue Vit D 50000 units 1 cap q 14 days for vitamin D  deficiency  ASSOCIATED CONDITIONS ADDRESSED TODAY  Action/Plan  Prediabetes Continue decreased simple carbs and focus on increased protein- trying to get 100 grams a day Continue to focus on weight loss -      Wegovy ; Inject 1.7 mg into the skin once a week.  Dispense: 3 mL; Refill: 0  Other hyperlipidemia Continue Rosuvastatin 10 mg daily Continue to decrease saturated fats in diet -     Wegovy ; Inject 1.7 mg into the skin once a week.  Dispense: 3 mL; Refill: 0  Vitamin D  deficiency Continue Vit D 50000 units 1 cap q 14 days.   Class 1 obesity with serious comorbidity and body mass index (BMI) of 31.0 to 31.9 in adult, unspecified obesity type Generalized obesity- Start BMI 33.11 Continue Category 1 plan 1000 calories and at least 70 grams of protein Continue exercise of 60 minutes 4 days a week, complete right track program at [facility]  and start swimming lessons Continue to push water- currently 64 ounces daily and 3 glasses of tea made with monk fruit -     Wegovy ; Inject 1.7 mg into the skin once a week.  Dispense: 3 mL; Refill: 0         Return in about 4 weeks (around 10/21/2023).[name] She was informed of the importance of frequent follow up visits to maximize her success with intensive lifestyle modifications for her multiple health conditions.   ATTESTASTION STATEMENTS:  Reviewed by clinician on day of visit: allergies, medications, problem list, medical history, surgical history, family history, social history, and previous encounter notes.   Time spent on visit including pre-visit chart review and post-visit care and charting was 30 minutes.   Adiya Selmer ANP-C

## 2023-09-24 ENCOUNTER — Ambulatory Visit (INDEPENDENT_AMBULATORY_CARE_PROVIDER_SITE_OTHER): Admitting: Physician Assistant

## 2023-09-24 ENCOUNTER — Telehealth (INDEPENDENT_AMBULATORY_CARE_PROVIDER_SITE_OTHER): Payer: Self-pay | Admitting: *Deleted

## 2023-09-24 NOTE — Telephone Encounter (Signed)
 Detra Hemminger (Key: [id number])  [employer] has not yet replied to your PA request. Depending on the information you've provided, additional questions may be returned by the plan. You may close this dialog, return to your dashboard, and perform other tasks.  To check for an update later, open this request again from your dashboard.  If [employer] has not replied to your request within 24 hours please contact [employer] at [phone].

## 2023-09-25 ENCOUNTER — Other Ambulatory Visit (HOSPITAL_BASED_OUTPATIENT_CLINIC_OR_DEPARTMENT_OTHER): Payer: Self-pay

## 2023-09-25 NOTE — Telephone Encounter (Signed)
 Your prior authorization for Wegovy  has been approved!  Message from plan: Your PA request has been approved. Additional information will be provided in the approval communication. (Message 1145). Authorization Expiration Date: September 24, 2024.   Patient has been notified via Mychart message.

## 2023-10-14 ENCOUNTER — Other Ambulatory Visit: Payer: Self-pay | Admitting: Obstetrics and Gynecology

## 2023-10-14 DIAGNOSIS — Z1231 Encounter for screening mammogram for malignant neoplasm of breast: Secondary | ICD-10-CM

## 2023-10-22 ENCOUNTER — Other Ambulatory Visit (HOSPITAL_BASED_OUTPATIENT_CLINIC_OR_DEPARTMENT_OTHER): Payer: Self-pay

## 2023-10-22 ENCOUNTER — Encounter (INDEPENDENT_AMBULATORY_CARE_PROVIDER_SITE_OTHER): Payer: Self-pay | Admitting: Physician Assistant

## 2023-10-22 ENCOUNTER — Ambulatory Visit (INDEPENDENT_AMBULATORY_CARE_PROVIDER_SITE_OTHER): Admitting: Physician Assistant

## 2023-10-22 VITALS — BP 112/70 | Temp 98.7°F | Ht 62.0 in | Wt 171.0 lb

## 2023-10-22 DIAGNOSIS — E785 Hyperlipidemia, unspecified: Secondary | ICD-10-CM

## 2023-10-22 DIAGNOSIS — Z733 Stress, not elsewhere classified: Secondary | ICD-10-CM

## 2023-10-22 DIAGNOSIS — R7303 Prediabetes: Secondary | ICD-10-CM | POA: Diagnosis not present

## 2023-10-22 DIAGNOSIS — E559 Vitamin D deficiency, unspecified: Secondary | ICD-10-CM | POA: Diagnosis not present

## 2023-10-22 DIAGNOSIS — E669 Obesity, unspecified: Secondary | ICD-10-CM

## 2023-10-22 DIAGNOSIS — Z6831 Body mass index (BMI) 31.0-31.9, adult: Secondary | ICD-10-CM

## 2023-10-22 DIAGNOSIS — G4700 Insomnia, unspecified: Secondary | ICD-10-CM

## 2023-10-22 DIAGNOSIS — E538 Deficiency of other specified B group vitamins: Secondary | ICD-10-CM

## 2023-10-22 DIAGNOSIS — E7849 Other hyperlipidemia: Secondary | ICD-10-CM

## 2023-10-22 MED ORDER — WEGOVY 1.7 MG/0.75ML ~~LOC~~ SOAJ
1.7000 mg | SUBCUTANEOUS | 0 refills | Status: DC
  Filled 2023-10-22: qty 3, 28d supply, fill #0

## 2023-10-22 NOTE — Progress Notes (Signed)
 SUBJECTIVE: Discussed the use of AI scribe software for clinical note transcription with the patient, who gave verbal consent to proceed.  Chief Complaint: Obesity  Interim History: She is down 2 lbs from her last visit.  Down 10 lbs overall  TBW loss of 5.5%  Rachael [name] is here to discuss her progress with her obesity treatment plan. She is on the Category 1 Plan and states she is following her eating plan approximately 90 % of the time. She states she is exercising swimming lessons/water aerobics 60 minutes 4-5 times per week.  Rachael [name] is a 63 year old female who presents for follow-up of her obesity treatment plan.  She is currently on Wegovy  1.7 mg weekly for obesity, hyperlipidemia, and prediabetes. No adverse effects such as nausea, vomiting, constipation, diarrhea, or changes in mood and vision are reported. However, she experiences difficulty sleeping, which she attributes to stress.  She is also on ergocalciferol  50,000 units once weekly for vitamin D  deficiency and takes Crestor 10 mg daily for hyperlipidemia.  She actively engages in physical activities, including swimming lessons and water aerobics, four times a week.  Financial constraints due to supporting her mother have impacted her ability to purchase food. We discussed utilizing food banks for assistance if needed.   She has a long-standing issue with sleep, often waking up after two hours and unable to return to sleep, attributing this to stress and worry. No relief is found with white noise or music. OBJECTIVE: Visit Diagnoses: Problem List Items Addressed This Visit     Hyperlipidemia   Relevant Medications   semaglutide -weight management (WEGOVY ) 1.7 MG/0.75ML SOAJ SQ injection   Vitamin D  deficiency   Prediabetes - Primary   Relevant Medications   semaglutide -weight management (WEGOVY ) 1.7 MG/0.75ML SOAJ SQ injection   Generalized obesity- Start BMI 33.11   Relevant Medications   semaglutide -weight  management (WEGOVY ) 1.7 MG/0.75ML SOAJ SQ injection   Vitamin B 12 deficiency  Obesity Obesity management is ongoing with lifestyle changes and medication. Participation in physical activities, including swimming and water aerobics, has contributed to muscle gain and weight loss. She is on Wegovy  1.7 mg weekly, tolerating it well without significant side effects. Improved body fat percentage and visceral fat rating indicate positive progress. - Continue Wegovy  1.7 mg weekly - Encourage continued participation in physical activities, including swimming and water aerobics Meds ordered this encounter  Medications   semaglutide -weight management (WEGOVY ) 1.7 MG/0.75ML SOAJ SQ injection    Sig: Inject 1.7 mg into the skin once a week.    Dispense:  3 mL    Refill:  0    Prediabetes - Encourage continued lifestyle modifications, including diet and exercise - Plan for fasting labs in November to monitor metabolic parameters  Hyperlipidemia Hyperlipidemia is managed with Crestor 10 mg daily. Lifestyle changes, including diet and exercise, contribute to lipid management. - Continue Crestor 10 mg daily - Encourage continued lifestyle modifications, including diet and exercise  Insomnia Insomnia is likely related to psychological stress and difficulty winding down at night, with frequent awakenings and inability to return to sleep. Stress management techniques, such as writing down thoughts, were discussed as potential aids. - Encourage stress management techniques, such as writing down thoughts before bed - Consider monitoring sleep patterns in relation to physical activity  Psychological stress Psychological stress is significant due to caregiving responsibilities and financial constraints, impacting sleep and overall well-being. Exercise is a beneficial outlet for stress reduction. - Encourage continued physical activity as a  stress reduction strategy - Discuss potential stress management  techniques, such as writing down thoughts  Vitamin D  deficiency Vitamin D  deficiency is managed with ergocalciferol  50,000 units once weekly. No issues with adherence or side effects were discussed. - Continue ergocalciferol  50,000 units once weekly  B 12 deficiency/folate Will plan to recheck labs over the next 1-2 visit.    Vitals Temp: 98.7 F (37.1 C) BP: 112/70 Pulse Rate: 86 SpO2: 99 %     Anthropometric Measurements Height: 5' 2 (1.575 m) Weight: 171 lb (77.6 kg) BMI (Calculated): 31.27 Weight at Last Visit: 173 lb Weight Lost Since Last Visit: 2 Weight Gained Since Last Visit: 0 lb Starting Weight: 181 lb Total Weight Loss (lbs): 10 lb (4.536 kg) Peak Weight: 181 lb     Body Composition  Body Fat %: 38 % Fat Mass (lbs): 65 lbs Muscle Mass (lbs): 100.8 lbs Total Body Water (lbs): 68.2 lbs Visceral Fat Rating : 10     Other Clinical Data Fasting: No Labs: No Today's Visit #: 26 Starting Date: 12/27/21  ASSESSMENT AND PLAN:  Diet: Rachael [name] is currently in the action stage of change. As such, her goal is to continue with weight loss efforts. She has agreed to Category 1 Plan.  Exercise: Rachael [name] has been instructed to work up to a goal of 150 minutes of combined cardio and strengthening exercise per week and to continue exercising as is for weight loss and overall health benefits.   Behavior Modification:  We discussed the following Behavioral Modification Strategies today: increasing lean protein intake, decreasing simple carbohydrates, increasing vegetables, increase H2O intake, increase high fiber foods, no skipping meals, meal planning and cooking strategies, avoiding temptations, and planning for success. We discussed various medication options to help Rachael [name] with her weight loss efforts and we both agreed to continue current treatment plan.  Follow up in 4 weeks. Rachael [name] She was informed of the importance of frequent follow up visits to maximize her success with  intensive lifestyle modifications for her multiple health conditions.  Attestation Statements:   Reviewed by clinician on day of visit: allergies, medications, problem list, medical history, surgical history, family history, social history, and previous encounter notes.   Time spent on visit including pre-visit chart review and post-visit care and charting was 20 minutes.    Radames Mejorado, PA-C

## 2023-11-14 ENCOUNTER — Other Ambulatory Visit: Payer: Self-pay | Admitting: Medical Genetics

## 2023-11-17 ENCOUNTER — Ambulatory Visit (INDEPENDENT_AMBULATORY_CARE_PROVIDER_SITE_OTHER): Admitting: Physician Assistant

## 2023-11-17 ENCOUNTER — Encounter (INDEPENDENT_AMBULATORY_CARE_PROVIDER_SITE_OTHER): Payer: Self-pay | Admitting: Physician Assistant

## 2023-11-17 ENCOUNTER — Other Ambulatory Visit (HOSPITAL_BASED_OUTPATIENT_CLINIC_OR_DEPARTMENT_OTHER): Payer: Self-pay

## 2023-11-17 VITALS — BP 116/72 | HR 86 | Temp 98.4°F | Ht 62.0 in | Wt 168.0 lb

## 2023-11-17 DIAGNOSIS — E785 Hyperlipidemia, unspecified: Secondary | ICD-10-CM | POA: Diagnosis not present

## 2023-11-17 DIAGNOSIS — Z6830 Body mass index (BMI) 30.0-30.9, adult: Secondary | ICD-10-CM

## 2023-11-17 DIAGNOSIS — E559 Vitamin D deficiency, unspecified: Secondary | ICD-10-CM

## 2023-11-17 DIAGNOSIS — E538 Deficiency of other specified B group vitamins: Secondary | ICD-10-CM | POA: Diagnosis not present

## 2023-11-17 DIAGNOSIS — R7303 Prediabetes: Secondary | ICD-10-CM | POA: Diagnosis not present

## 2023-11-17 DIAGNOSIS — E669 Obesity, unspecified: Secondary | ICD-10-CM

## 2023-11-17 DIAGNOSIS — E7849 Other hyperlipidemia: Secondary | ICD-10-CM

## 2023-11-17 MED ORDER — WEGOVY 1.7 MG/0.75ML ~~LOC~~ SOAJ
1.7000 mg | SUBCUTANEOUS | 0 refills | Status: DC
  Filled 2023-11-17: qty 3, 28d supply, fill #0

## 2023-11-17 NOTE — Progress Notes (Unsigned)
 SUBJECTIVE: Discussed the use of AI scribe software for clinical note transcription with the patient, who gave verbal consent to proceed.  Chief Complaint: Obesity  Interim History: She is down 3 lbs since her last visit.  Down 13 lbs overall TBW loss of 7.2%  Rachael [name] is here to discuss her progress with her obesity treatment plan. She is on the Category 1 Plan and states she is following her eating plan approximately 90 % of the time. She states she is exercising water aerobics/treadmill/cross strength machines 60+ minutes 6 times per week. Rachael [name] is a 63 year old female who presents for a follow-up on medical weight loss.  She has experienced a significant reduction in overall body adipose percentage and weight loss since her last visit, losing 4.2 pounds of adipose tissue, although her total weight decreased by 3 pounds. Her visceral adipose rating is now 10, with a goal of 12 or less. Her current exercise regimen includes water aerobics twice a week, a relaxing stretch session on Sundays, and pool exercises on Saturdays. She is considering increasing her exercise frequency to six days a week.  She is currently taking Wegovy  at a dose of 1.7 mg and reports no adverse effects such as nausea, vomiting, constipation, diarrhea, or difficulty swallowing. She is satisfied with its effects on her weight management.  She mentions difficulty with sleep, stating that she goes to bed by 9 PM but often wakes up around 12 or 1 AM and is unable to return to sleep. She enjoys reading mystery books, which may contribute to her difficulty falling back asleep. She prefers a warm sleeping environment and needs to wear socks to sleep comfortably.  Her social activities include engaging in various home improvement projects with her neighbor, such as painting and gardening. She describes herself as very active and often seeks new projects to keep busy. She also mentions that her daughter is currently on  furlough due to government issues, and she provides financial support to her daughter when needed.  OBJECTIVE: Visit Diagnoses: Problem List Items Addressed This Visit     Hyperlipidemia - Primary   Relevant Medications   semaglutide -weight management (WEGOVY ) 1.7 MG/0.75ML SOAJ SQ injection   Vitamin D  deficiency   Prediabetes   Relevant Medications   semaglutide -weight management (WEGOVY ) 1.7 MG/0.75ML SOAJ SQ injection   Generalized obesity- Start BMI 33.11   Relevant Medications   semaglutide -weight management (WEGOVY ) 1.7 MG/0.75ML SOAJ SQ injection   Vitamin B 12 deficiency   Other Visit Diagnoses       BMI 30.0-30.9,adult Current BMI 30.8          Obesity Obesity is being managed with pharmacotherapy. Significant progress in weight loss and reduction in body adipose percentage, currently at 36.1%, nearing the goal of 35%. She has lost 4.2 pounds of adipose tissue since the last visit. Regular physical activity, including water aerobics and strength training, is contributing to muscle gain and fat loss. She is tolerating Wegovy  well without any adverse effects such as nausea, vomiting, or gastrointestinal issues. - Continue Wegovy  at 1.7 mg - Encourage continuation of current exercise regimen, including water aerobics and strength training - Schedule follow-up in one month  Prediabetes Prediabetes is being managed as part of the overall weight management strategy. Lifestyle changes, including diet and exercise, are aimed at reducing the risk of progression to diabetes. The use of Wegovy  is also contributing to weight loss, which is beneficial for glycemic control. - Continue current lifestyle modifications and  Wegovy  as part of weight management strategy  Hyperlipidemia Hyperlipidemia is being managed in conjunction with obesity and prediabetes. Weight loss and lifestyle changes are expected to have a positive impact on lipid levels. The use of Wegovy  is also part of the  management plan for hyperlipidemia. - Continue current lifestyle modifications and Wegovy  as part of hyperlipidemia management  General Health Maintenance She is actively engaged in physical activities and maintaining a healthy lifestyle. She is not currently taking vitamin D  supplements as she spends time outdoors, which helps with natural vitamin D  synthesis. - Encourage continued outdoor activities for natural vitamin D  synthesis Vitals Temp: 98.4 F (36.9 C) BP: 116/72 Pulse Rate: 86 SpO2: 98 %   Anthropometric Measurements Height: 5' 2 (1.575 m) Weight: 168 lb (76.2 kg) BMI (Calculated): 30.72 Weight at Last Visit: 171 lb Weight Lost Since Last Visit: 3 lb Weight Gained Since Last Visit: 0 Starting Weight: 181 lb Total Weight Loss (lbs): 13 lb (5.897 kg)   Body Composition  Body Fat %: 36.1 % Fat Mass (lbs): 60.8 lbs Muscle Mass (lbs): 102.4 lbs Total Body Water (lbs): 67.4 lbs Visceral Fat Rating : 10   Other Clinical Data Fasting: No Labs: No Today's Visit #: 27 Starting Date: 12/27/21     ASSESSMENT AND PLAN:  Diet: Rachael [name] is currently in the action stage of change. As such, her goal is to continue with weight loss efforts. She has agreed to Category 1 Plan.  Exercise: Rachael [name] has been instructed to continue exercising as is for weight loss and overall health benefits.   Behavior Modification:  We discussed the following Behavioral Modification Strategies today: increasing lean protein intake, decreasing simple carbohydrates, increasing vegetables, increase H2O intake, increase high fiber foods, meal planning and cooking strategies, avoiding temptations, and planning for success. We discussed various medication options to help Rachael [name] with her weight loss efforts and we both agreed to continue current treatment plan.  Return in about 4 weeks (around 12/15/2023).Rachael [name] She was informed of the importance of frequent follow up visits to maximize her success with  intensive lifestyle modifications for her multiple health conditions.  Attestation Statements:   Reviewed by clinician on day of visit: allergies, medications, problem list, medical history, surgical history, family history, social history, and previous encounter notes.   Time spent on visit including pre-visit chart review and post-visit care and charting was *** minutes.    Layan Zalenski, PA-C

## 2023-11-21 ENCOUNTER — Ambulatory Visit
Admission: RE | Admit: 2023-11-21 | Discharge: 2023-11-21 | Disposition: A | Discharge status: Home / Self Care | Source: Ambulatory Visit | Attending: Obstetrics and Gynecology | Admitting: Obstetrics and Gynecology

## 2023-11-21 DIAGNOSIS — Z1231 Encounter for screening mammogram for malignant neoplasm of breast: Secondary | ICD-10-CM

## 2023-12-14 NOTE — Progress Notes (Unsigned)
 SUBJECTIVE: Discussed the use of AI scribe software for clinical note transcription with the patient, who gave verbal consent to proceed.  Chief Complaint: Obesity  Interim History: She has maintained her weight since her last visit.  Down 13 lbs overall TBW loss of 7.2 %  Rachael [name] is here to discuss her progress with her obesity treatment plan. She is on the Category 1 Plan and states she is following her eating plan approximately 70 % of the time. She states she is exercising treadmill/water aerobics 45-90 minutes 6 times per week.  Rachael [name] is a 63 year old female who presents for follow-up of her obesity therapy treatment plan.  She is experiencing significant oral pain and difficulty eating following recent dental work for periodontal disease. Her gums are sore, making it difficult to chew or consume solid foods, resulting in a diet primarily consisting of protein shakes, watery mashed potatoes, and soup. Despite having an appetite, she is unable to eat solid foods due to pain.  She is currently taking Wegovy  for hyperlipidemia, prediabetes and weight management and has not experienced nausea, vomiting, diarrhea, constipation, or difficulty swallowing. She is also taking vitamin D - Ergocalciferol  50,000 units  every other week and has about three or four doses left.  She maintains a high level of physical activity, exercising 45 to 90 minutes daily, six days a week, including treadmill workouts and water aerobics, but has decreased her activities following her recent dental surgery. She also participates in stretching and relaxation exercises once a week. She is attempting to consume protein shakes, watery mashed potatoes, and soup to maintain her nutrition despite her current dietary limitations.  Socially, she is still working and takes Fridays off to manage her projects at home. She plans to travel to [place] for Thanksgiving. OBJECTIVE: Visit Diagnoses: Problem List Items  Addressed This Visit     Hyperlipidemia   Relevant Medications   semaglutide -weight management (WEGOVY ) 1.7 MG/0.75ML SOAJ SQ injection   Vitamin D  deficiency   Prediabetes - Primary   Relevant Medications   semaglutide -weight management (WEGOVY ) 1.7 MG/0.75ML SOAJ SQ injection   Generalized obesity- Start BMI 33.11   Relevant Medications   semaglutide -weight management (WEGOVY ) 1.7 MG/0.75ML SOAJ SQ injection   Other Visit Diagnoses       elevated HR         Pain following oral surgery         BMI 30.0-30.9,adult Current BMI 30.9         Obesity Management is ongoing with a focus on exercise and dietary intake. She is engaging in regular physical activity, including treadmill and water aerobics, and is following a protein-rich diet. Recent dental surgery has temporarily impacted her ability to eat solid foods, but she is maintaining protein intake through shakes and soft foods. Weight is stable, but there is a goal to continue weight loss. - Continue current exercise regimen, including treadmill and water aerobics. - Maintain protein intake through shakes and soft foods. - Consider Greek yogurt with low sugar content as a protein source. - Encouraged portion control and mindful eating, especially during Thanksgiving. - [facility]. Continue/refill Wegovy  1.7 mg weekly Meds ordered this encounter  Medications   semaglutide -weight management (WEGOVY ) 1.7 MG/0.75ML SOAJ SQ injection    Sig: Inject 1.7 mg into the skin once a week.    Dispense:  3 mL    Refill:  0   Hyperlipidemia LDL is at goal. Medication(s): rosuvastatin 10 mg daily-  no reported SE.    Wegovy  1.7 mg weekly . Denies mass in neck, dysphagia, dyspepsia, persistent hoarseness, abdominal pain, or N/V/Constipation or diarrhea. Has annual eye exam. Mood is stable.   Cardiovascular risk factors: dyslipidemia and obesity (BMI >= 30 kg/m2)  Lab Results  Component Value Date   CHOL 143  12/27/2021   HDL 50 12/27/2021   LDLCALC 78 12/27/2021   TRIG 79 12/27/2021   Lab Results  Component Value Date   ALT 26 06/04/2023   AST 20 06/04/2023   ALKPHOS 83 06/04/2023   BILITOT 0.3 06/04/2023   The 10-year ASCVD risk score (Arnett DK, et al., 2019) is: 4.2%   Values used to calculate the score:     Age: 44 years     Clincally relevant sex: Female     Is Non-Hispanic African American: Yes     Diabetic: No     Tobacco smoker: No     Systolic Blood Pressure: 122 mmHg     Is BP treated: No     HDL Cholesterol: 50 mg/dL     Total Cholesterol: 143 mg/dL  Plan: Continue statin. Continue Wegovy  Continue to work on nutrition plan -decreasing simple carbohydrates, increasing lean proteins, decreasing saturated fats and cholesterol , avoiding trans fats and exercise as able to promote weight loss, improve lipids and decrease cardiovascular risks.     Tachycardia Heart rate is elevated today, but the cause is not clearly identified. It may be related to recent dental surgery and associated stress or pain. Plan: Will monitor with patient on GLP medication  Vitamin D  deficiency She is taking vitamin D  every other week and has a sufficient supply.Ergocalciferol  50,000 units every 14 days. No N/V ormuscle weakness with Ergocalciferol .  Last vitamin D  Lab Results  Component Value Date   VD25OH 41.7 06/04/2023   Low vitamin D  levels can be associated with adiposity and may result in leptin resistance and weight gain. Also associated with fatigue.  Currently on vitamin D  supplementation without any adverse effects such as nausea, vomiting or muscle weakness.  Recheck vitamin D  level with next lab draw.   Continue Ergocalciferol  50,000 units every other week.  Oral pain and difficulty eating due to recent dental surgery Recent dental surgery has resulted in significant oral pain and difficulty eating solid foods. She is experiencing sore gums and is limited to soft foods and  protein shakes. Pain management and dietary adjustments are necessary to support healing. - Continue protein shakes and soft foods to maintain nutrition. - Consider Greek yogurt as a protein source.  Vitals Temp: 99.1 F (37.3 C) BP: 122/85 Pulse Rate: (!) 106 SpO2: 97 %   Anthropometric Measurements Height: 5' 2 (1.575 m) Weight: 168 lb (76.2 kg) BMI (Calculated): 30.72 Weight at Last Visit: 168 lb Weight Lost Since Last Visit: 0 Weight Gained Since Last Visit: 0 Starting Weight: 181 lb Total Weight Loss (lbs): 13 lb (5.897 kg)   Body Composition  Body Fat %: 42.8 % Fat Mass (lbs): 72.2 lbs Muscle Mass (lbs): 91.6 lbs Total Body Water (lbs): 66 lbs Visceral Fat Rating : 11   Other Clinical Data Fasting: No Labs: No Today's Visit #: 28 Starting Date: 12/17/21     ASSESSMENT AND PLAN:  Diet: Rachael [name] is currently in the action stage of change. As such, her goal is to continue with weight loss efforts. She has agreed to Category 1 Plan.  Exercise: Rachael [name] has been instructed to continue exercising as is for weight  loss and overall health benefits.   Behavior Modification:  We discussed the following Behavioral Modification Strategies today: increasing lean protein intake, decreasing simple carbohydrates, increasing vegetables, increase H2O intake, increase high fiber foods, meal planning and cooking strategies, avoiding temptations, and planning for success. We discussed various medication options to help Rachael [name] with her weight loss efforts and we both agreed to continue current treatment plan.  Return in about 4 weeks (around 01/12/2024).Rachael [name] She was informed of the importance of frequent follow up visits to maximize her success with intensive lifestyle modifications for her multiple health conditions.  Attestation Statements:   Reviewed by clinician on day of visit: allergies, medications, problem list, medical history, surgical history, family history, social history,  and previous encounter notes.   Time spent on visit including pre-visit chart review and post-visit care and charting was 35 minutes.    Yaniel Limbaugh, PA-C

## 2023-12-15 ENCOUNTER — Ambulatory Visit (INDEPENDENT_AMBULATORY_CARE_PROVIDER_SITE_OTHER): Admitting: Physician Assistant

## 2023-12-15 ENCOUNTER — Encounter (INDEPENDENT_AMBULATORY_CARE_PROVIDER_SITE_OTHER): Payer: Self-pay | Admitting: Physician Assistant

## 2023-12-15 ENCOUNTER — Other Ambulatory Visit (HOSPITAL_BASED_OUTPATIENT_CLINIC_OR_DEPARTMENT_OTHER): Payer: Self-pay

## 2023-12-15 VITALS — BP 122/85 | HR 106 | Temp 99.1°F | Ht 62.0 in | Wt 168.0 lb

## 2023-12-15 DIAGNOSIS — R7303 Prediabetes: Secondary | ICD-10-CM | POA: Diagnosis not present

## 2023-12-15 DIAGNOSIS — E538 Deficiency of other specified B group vitamins: Secondary | ICD-10-CM

## 2023-12-15 DIAGNOSIS — E669 Obesity, unspecified: Secondary | ICD-10-CM

## 2023-12-15 DIAGNOSIS — E7849 Other hyperlipidemia: Secondary | ICD-10-CM

## 2023-12-15 DIAGNOSIS — Z6830 Body mass index (BMI) 30.0-30.9, adult: Secondary | ICD-10-CM

## 2023-12-15 DIAGNOSIS — R000 Tachycardia, unspecified: Secondary | ICD-10-CM | POA: Diagnosis not present

## 2023-12-15 DIAGNOSIS — E785 Hyperlipidemia, unspecified: Secondary | ICD-10-CM

## 2023-12-15 DIAGNOSIS — E559 Vitamin D deficiency, unspecified: Secondary | ICD-10-CM

## 2023-12-15 DIAGNOSIS — G8918 Other acute postprocedural pain: Secondary | ICD-10-CM

## 2023-12-15 MED ORDER — WEGOVY 1.7 MG/0.75ML ~~LOC~~ SOAJ
1.7000 mg | SUBCUTANEOUS | 0 refills | Status: DC
  Filled 2023-12-15: qty 3, 28d supply, fill #0

## 2023-12-24 ENCOUNTER — Other Ambulatory Visit

## 2023-12-24 DIAGNOSIS — Z006 Encounter for examination for normal comparison and control in clinical research program: Secondary | ICD-10-CM

## 2024-01-05 LAB — GENECONNECT MOLECULAR SCREEN: Genetic Analysis Overall Interpretation: NEGATIVE

## 2024-01-12 ENCOUNTER — Encounter (INDEPENDENT_AMBULATORY_CARE_PROVIDER_SITE_OTHER): Payer: Self-pay | Admitting: Physician Assistant

## 2024-01-12 ENCOUNTER — Ambulatory Visit (INDEPENDENT_AMBULATORY_CARE_PROVIDER_SITE_OTHER): Admitting: Physician Assistant

## 2024-01-12 ENCOUNTER — Other Ambulatory Visit (HOSPITAL_BASED_OUTPATIENT_CLINIC_OR_DEPARTMENT_OTHER): Payer: Self-pay

## 2024-01-12 VITALS — BP 147/84 | HR 99 | Temp 98.1°F | Ht 62.0 in | Wt 170.0 lb

## 2024-01-12 DIAGNOSIS — E559 Vitamin D deficiency, unspecified: Secondary | ICD-10-CM

## 2024-01-12 DIAGNOSIS — E669 Obesity, unspecified: Secondary | ICD-10-CM | POA: Diagnosis not present

## 2024-01-12 DIAGNOSIS — Z6831 Body mass index (BMI) 31.0-31.9, adult: Secondary | ICD-10-CM

## 2024-01-12 DIAGNOSIS — E785 Hyperlipidemia, unspecified: Secondary | ICD-10-CM | POA: Diagnosis not present

## 2024-01-12 DIAGNOSIS — R7303 Prediabetes: Secondary | ICD-10-CM | POA: Diagnosis not present

## 2024-01-12 DIAGNOSIS — E7849 Other hyperlipidemia: Secondary | ICD-10-CM

## 2024-01-12 MED ORDER — VITAMIN D (ERGOCALCIFEROL) 1.25 MG (50000 UNIT) PO CAPS
50000.0000 [IU] | ORAL_CAPSULE | ORAL | 0 refills | Status: AC
  Filled 2024-01-12: qty 2, 28d supply, fill #0

## 2024-01-12 MED ORDER — WEGOVY 1.7 MG/0.75ML ~~LOC~~ SOAJ
1.7000 mg | SUBCUTANEOUS | 0 refills | Status: DC
  Filled 2024-01-12: qty 3, 28d supply, fill #0

## 2024-01-12 NOTE — Progress Notes (Signed)
 SUBJECTIVE: Discussed the use of AI scribe software for clinical note transcription with the patient, who gave verbal consent to proceed.  Chief Complaint: Obesity  Interim History: She is up 2 lbs since her last visit.   [name] is here to discuss her progress with her obesity treatment plan. She is on the Bluelinx and states she is following her eating plan approximately 75 % of the time. She states she is exercising treadmill/weights/water aerobics 60 minutes 5 times per week.  Rachael [name] is a 63 year old female who presents for follow-up of her obesity treatment plan.  She follows a pescatarian diet approximately 75% of the time, tracks her calories, consumes more whole foods, and ensures adequate protein intake. She drinks sufficient water and does not skip meals. Despite these efforts, she has gained two pounds since her last visit, with a current weight of 170 pounds and a BMI of 31.1.  Her physical activity includes walking on the treadmill, working out with weights, and participating in water aerobics for sixty minutes five days per week. A recent disruption in her exercise routine due to travel and family commitments affected her ability to adhere to her plan. She notes an increase in blood pressure during this period.  Her current medications include Wegovy  1.7 mg weekly, rosuvastatin 10 mg daily, and ergocalciferol  50,000 units every fourteen days. She reports no issues with the Wegovy  and confirms she is not skipping meals.  She experiences difficulty sleeping, describing her sleep issue as 'staying asleep' rather than falling asleep. She sometimes takes Benadryl  to help her sleep.  She has a history of polyphagia, vitamin D  deficiency, and hyperlipidemia. Her last thyroid function tests in April were normal. OBJECTIVE: Visit Diagnoses: Problem List Items Addressed This Visit     Hyperlipidemia   Relevant Medications   semaglutide -weight management (WEGOVY ) 1.7  MG/0.75ML SOAJ SQ injection   Vitamin D  deficiency   Relevant Medications   Vitamin D , Ergocalciferol , (DRISDOL ) 1.25 MG (50000 UNIT) CAPS capsule   Prediabetes   Relevant Medications   semaglutide -weight management (WEGOVY ) 1.7 MG/0.75ML SOAJ SQ injection   Generalized obesity- Start BMI 33.11   Relevant Medications   semaglutide -weight management (WEGOVY ) 1.7 MG/0.75ML SOAJ SQ injection   Other Visit Diagnoses       BMI 31.0-31.9,adult Current BMI 31.1    -  Primary      Obesity BMI of 31.1. Weight increased by 2 pounds since last visit. Following a pescatarian diet 75% of the time, tracking calories, consuming adequate protein, and maintaining hydration. Engaging in regular physical activity including treadmill walking, weight training, and water aerobics for 60 minutes, 5 days a week. Currently on Wegovy  1.7 mg weekly with no reported issues. Emphasis on maintaining weight during December and increasing muscle mass to reduce adipose percentage. Denies mass in neck, dysphagia, dyspepsia, persistent hoarseness, abdominal pain, or N/V/Constipation or diarrhea. Has annual eye exam. Mood is stable.   - Continue/refill Wegovy  1.7 mg weekly - Encouraged adherence to pescatarian diet and regular physical activity - Discussed strategies to maintain weight during December - Encouraged increasing muscle mass through weight training Meds ordered this encounter  Medications   semaglutide -weight management (WEGOVY ) 1.7 MG/0.75ML SOAJ SQ injection    Sig: Inject 1.7 mg into the skin once a week.    Dispense:  3 mL    Refill:  0   Vitamin D , Ergocalciferol , (DRISDOL ) 1.25 MG (50000 UNIT) CAPS capsule    Sig: Take 1 capsule (50,000 Units  total) by mouth every 14 (fourteen) days.    Dispense:  12 capsule    Refill:  0   Prediabetes Last A1c was 6.0- not at goal. Insulin  - no recent values  Medication(s): Wegovy  1.7 SQ weekly Denies mass in neck, dysphagia, dyspepsia, persistent hoarseness,  abdominal pain, or N/V/Constipation or diarrhea. Has annual eye exam. Mood is stable.   Polyphagia:No Lab Results  Component Value Date   HGBA1C 6.0 (H) 06/04/2023   HGBA1C 6.0 (H) 11/18/2022   HGBA1C 6.1 (H) 12/27/2021   Lab Results  Component Value Date   INSULIN  12.1 12/27/2021    Plan: Continue and refill Wegovy  1.7 SQ weekly Continue working on nutrition plan to decrease simple carbohydrates, increase lean proteins and exercise to promote weight loss, improve glycemic control and prevent progression to Type 2 diabetes.   Hyperlipidemia Managed with rosuvastatin 10 mg daily. No reported issues with medication.  [facility] Last lipids Lab Results  Component Value Date   CHOL 143 12/27/2021   HDL 50 12/27/2021   LDLCALC 78 12/27/2021   TRIG 79 12/27/2021   The 10-year ASCVD risk score (Arnett DK, et al., 2019) is: 6.8%   Values used to calculate the score:     Age: 89 years     Clincally relevant sex: Female     Is Non-Hispanic African American: Yes     Diabetic: No     Tobacco smoker: No     Systolic Blood Pressure: 147 mmHg     Is BP treated: No     HDL Cholesterol: 50 mg/dL     Total Cholesterol: 143 mg/dL  - Continue rosuvastatin 10 mg daily Continue Wegovy  1.7 mg weekly for CV risk reduction in addition to statin therapy. Continue to work on nutrition plan -decreasing simple carbohydrates, increasing lean proteins, decreasing saturated fats and cholesterol , avoiding trans fats and exercise as able to promote weight loss, improve lipids and decrease cardiovascular risks. - Will check cholesterol levels over the next 1-2 visits.   Vitamin D  deficiency Managed with ergocalciferol  50,000 units every 14 days. No reported issues with medication. No N/V or muscle weakness with Ergocalciferol  Last vitamin D  Lab Results  Component Value Date   VD25OH 41.7 06/04/2023   Low vitamin D  levels can be associated with adiposity and may  result in leptin resistance and weight gain. Also associated with fatigue.  Currently on vitamin D  supplementation without any adverse effects such as nausea, vomiting or muscle weakness.  - Continue ergocalciferol  50,000 units every 14 days - Refilled vitamin D  prescription Vitals Temp: 98.1 F (36.7 C) BP: (!) 147/84 Pulse Rate: 99 SpO2: 99 %   Anthropometric Measurements Height: 5' 2 (1.575 m) Weight: 170 lb (77.1 kg) BMI (Calculated): 31.09 Weight at Last Visit: 168 lb Weight Lost Since Last Visit: 0 Weight Gained Since Last Visit: 2 lb Starting Weight: 181 lb Total Weight Loss (lbs): 11 lb (4.99 kg)   Body Composition  Body Fat %: 43.3 % Fat Mass (lbs): 73.6 lbs Muscle Mass (lbs): 91.4 lbs Total Body Water (lbs): 67.2 lbs Visceral Fat Rating : 11   Other Clinical Data Fasting: No Labs: No Today's Visit #: 29 Starting Date: 12/27/21     ASSESSMENT AND PLAN:  Diet: Tahnee is currently in the action stage of change. As such, her goal is to continue with weight loss efforts. She has agreed to Bluelinx.  Exercise: Mionna has been instructed to continue exercising as  is for weight loss and overall health benefits.   Behavior Modification:  We discussed the following Behavioral Modification Strategies today: increasing lean protein intake, decreasing simple carbohydrates, increasing vegetables, increase H2O intake, increase high fiber foods, meal planning and cooking strategies, holiday eating strategies, avoiding temptations, and planning for success. We discussed various medication options to help Ilsa with her weight loss efforts and we both agreed to continue Wegovy  1.7 mg weekly for medical weight loss and hyperlipidemia.  Return in about 4 weeks (around 02/09/2024).[name] She was informed of the importance of frequent follow up visits to maximize her success with intensive lifestyle modifications for her multiple health conditions.  Attestation  Statements:   Reviewed by clinician on day of visit: allergies, medications, problem list, medical history, surgical history, family history, social history, and previous encounter notes.   Time spent on visit including pre-visit chart review and post-visit care and charting was 20 minutes.    Brax Walen, PA-C

## 2024-01-13 ENCOUNTER — Other Ambulatory Visit (HOSPITAL_BASED_OUTPATIENT_CLINIC_OR_DEPARTMENT_OTHER): Payer: Self-pay

## 2024-01-19 ENCOUNTER — Other Ambulatory Visit (HOSPITAL_BASED_OUTPATIENT_CLINIC_OR_DEPARTMENT_OTHER): Payer: Self-pay

## 2024-01-21 ENCOUNTER — Other Ambulatory Visit (HOSPITAL_BASED_OUTPATIENT_CLINIC_OR_DEPARTMENT_OTHER): Payer: Self-pay

## 2024-02-16 ENCOUNTER — Ambulatory Visit (INDEPENDENT_AMBULATORY_CARE_PROVIDER_SITE_OTHER): Admitting: Physician Assistant

## 2024-02-18 ENCOUNTER — Encounter (INDEPENDENT_AMBULATORY_CARE_PROVIDER_SITE_OTHER): Payer: Self-pay | Admitting: Family Medicine

## 2024-02-18 ENCOUNTER — Ambulatory Visit (INDEPENDENT_AMBULATORY_CARE_PROVIDER_SITE_OTHER): Admitting: Family Medicine

## 2024-02-18 ENCOUNTER — Other Ambulatory Visit (HOSPITAL_BASED_OUTPATIENT_CLINIC_OR_DEPARTMENT_OTHER): Payer: Self-pay

## 2024-02-18 VITALS — BP 109/67 | HR 99 | Temp 98.6°F | Ht 62.0 in | Wt 167.0 lb

## 2024-02-18 DIAGNOSIS — E669 Obesity, unspecified: Secondary | ICD-10-CM | POA: Diagnosis not present

## 2024-02-18 DIAGNOSIS — E7849 Other hyperlipidemia: Secondary | ICD-10-CM | POA: Diagnosis not present

## 2024-02-18 DIAGNOSIS — R7303 Prediabetes: Secondary | ICD-10-CM

## 2024-02-18 DIAGNOSIS — Z6830 Body mass index (BMI) 30.0-30.9, adult: Secondary | ICD-10-CM

## 2024-02-18 MED ORDER — WEGOVY 1.7 MG/0.75ML ~~LOC~~ SOAJ
1.7000 mg | SUBCUTANEOUS | 0 refills | Status: DC
  Filled 2024-02-18: qty 3, 28d supply, fill #0

## 2024-02-18 MED ORDER — WEGOVY 1.7 MG/0.75ML ~~LOC~~ SOAJ
1.7000 mg | SUBCUTANEOUS | 1 refills | Status: AC
  Filled 2024-02-18: qty 3, 28d supply, fill #0

## 2024-02-18 NOTE — Progress Notes (Signed)
 "  SUBJECTIVE:  Chief Complaint: Obesity  Interim History: Patient is trying to stay consistent with her food intake and bring her overall A1c.  Over the last 4 weeks she went and saw her niece in [place] and then went to [place]  because her aunt died.  She is now back and is trying to get back to the gym and get back into her routine.  She is happy to see family.  She is doing well on Wegovy  but has had more sugar cravings than she did previously.  She is not sure why her cravings have increased in intensity.  Does feel the pescatarian plan will be the easiest to follow over the next few weeks.  Rachael [name] is here to discuss her progress with her obesity treatment plan. She is on the Bluelinx and states she is following her eating plan approximately 50 % of the time. She states she is not exercising for 2 week.   OBJECTIVE: Visit Diagnoses: Problem List Items Addressed This Visit       Other   Hyperlipidemia   Relevant Medications   semaglutide -weight management (WEGOVY ) 1.7 MG/0.75ML SOAJ SQ injection   Prediabetes   Relevant Medications   semaglutide -weight management (WEGOVY ) 1.7 MG/0.75ML SOAJ SQ injection   Generalized obesity- Start BMI 33.11   Relevant Medications   semaglutide -weight management (WEGOVY ) 1.7 MG/0.75ML SOAJ SQ injection   Other Visit Diagnoses       BMI 30.0-30.9,adult Current BMI 30.9    -  Primary       Vitals Temp: 98.6 F (37 C) BP: 109/67 Pulse Rate: 99 SpO2: 97 %   Anthropometric Measurements Height: 5' 2 (1.575 m) Weight: 167 lb (75.8 kg) BMI (Calculated): 30.54 Weight at Last Visit: 170 lb Weight Lost Since Last Visit: 3 Weight Gained Since Last Visit: 0 Starting Weight: 181 lb Total Weight Loss (lbs): 14 lb (6.35 kg)   Body Composition  Body Fat %: 42.7 % Fat Mass (lbs): 71.4 lbs Muscle Mass (lbs): 90.8 lbs Total Body Water (lbs): 66.4 lbs Visceral Fat Rating : 11   Other Clinical Data Today's Visit #: 30 Starting  Date: 12/27/21 Comments: Pesc     ASSESSMENT AND PLAN: Assessment & Plan Prediabetes Patient doing well on Wegovy  with no GI side effects.  Last A1c of 6.0.  Will get repeat labs with PCP next month. Other hyperlipidemia Patient is working on dietary and lifestyle changes to further decrease cholesterol levels.  Patient is on rosuvastatin 10 mg daily and denies myalgias with use usage.  Continue rosuvastatin and will need repeat labs with PCP or with us  in the next 4 to 6 months. BMI 30.0-30.9,adult Current BMI 30.9  Generalized obesity- Start BMI 33.11    Diet: Rachael [name] is currently in the action stage of change. As such, her goal is to continue with weight loss efforts and has agreed to the Bluelinx.   Exercise:  All adults should avoid inactivity. Some activity is better than none, and adults who participate in any amount of physical activity, gain some health benefits. and Adults should also include muscle-strengthening activities that involve all major muscle groups on 2 or more days a week.  Patient to start creatine 5g daily for assistance in muscle mass maintenance and brain health.  Behavior Modification:  We discussed the following Behavioral Modification Strategies today: increasing lean protein intake, decreasing simple carbohydrates, increasing vegetables, meal planning and cooking strategies, keeping healthy foods in the home, and planning for success. We  discussed various medication options to help Rachael [name] with her weight loss efforts and we both agreed to continue Wegovy  at 1.7mg  weekly.  Return in about 4 weeks (around 03/17/2024).   She was informed of the importance of frequent follow up visits to maximize her success with intensive lifestyle modifications for her multiple health conditions.  Attestation Statements:   Reviewed by clinician on day of visit: allergies, medications, problem list, medical history, surgical history, family history, social history, and  previous encounter notes.    Rachael Cho, MD "

## 2024-02-18 NOTE — Assessment & Plan Note (Signed)
 Patient doing well on Wegovy  with no GI side effects.  Last A1c of 6.0.  Will get repeat labs with PCP next month.

## 2024-02-22 NOTE — Assessment & Plan Note (Signed)
 Patient is working on dietary and lifestyle changes to further decrease cholesterol levels.  Patient is on rosuvastatin 10 mg daily and denies myalgias with use usage.  Continue rosuvastatin and will need repeat labs with PCP or with us  in the next 4 to 6 months.

## 2024-03-31 ENCOUNTER — Ambulatory Visit (INDEPENDENT_AMBULATORY_CARE_PROVIDER_SITE_OTHER): Admitting: Physician Assistant
